# Patient Record
Sex: Male | Born: 1935 | Race: Black or African American | Hispanic: No | Smoking: Never smoker
Health system: Southern US, Community
[De-identification: ages and names within clinical notes are randomized; demographics above are authoritative.]

## PROBLEM LIST (undated history)

## (undated) DIAGNOSIS — I10 Essential (primary) hypertension: Secondary | ICD-10-CM

## (undated) DIAGNOSIS — Z9289 Personal history of other medical treatment: Secondary | ICD-10-CM

## (undated) DIAGNOSIS — N289 Disorder of kidney and ureter, unspecified: Secondary | ICD-10-CM

## (undated) DIAGNOSIS — K2210 Ulcer of esophagus without bleeding: Secondary | ICD-10-CM

## (undated) DIAGNOSIS — D696 Thrombocytopenia, unspecified: Secondary | ICD-10-CM

## (undated) DIAGNOSIS — K279 Peptic ulcer, site unspecified, unspecified as acute or chronic, without hemorrhage or perforation: Secondary | ICD-10-CM

## (undated) DIAGNOSIS — K922 Gastrointestinal hemorrhage, unspecified: Secondary | ICD-10-CM

## (undated) DIAGNOSIS — B1920 Unspecified viral hepatitis C without hepatic coma: Secondary | ICD-10-CM

## (undated) DIAGNOSIS — K754 Autoimmune hepatitis: Secondary | ICD-10-CM

## (undated) DIAGNOSIS — E785 Hyperlipidemia, unspecified: Secondary | ICD-10-CM

## (undated) DIAGNOSIS — N183 Chronic kidney disease, stage 3 (moderate): Secondary | ICD-10-CM

## (undated) HISTORY — DX: Essential (primary) hypertension: I10

## (undated) HISTORY — DX: Hyperlipidemia, unspecified: E78.5

---

## 2000-02-18 ENCOUNTER — Ambulatory Visit (HOSPITAL_COMMUNITY): Admission: RE | Admit: 2000-02-18 | Discharge: 2000-02-18 | Payer: Self-pay | Admitting: Nephrology

## 2000-02-21 ENCOUNTER — Ambulatory Visit (HOSPITAL_COMMUNITY): Admission: RE | Admit: 2000-02-21 | Discharge: 2000-02-22 | Payer: Self-pay | Admitting: Nephrology

## 2000-02-21 ENCOUNTER — Encounter: Payer: Self-pay | Admitting: Nephrology

## 2014-03-26 DIAGNOSIS — E785 Hyperlipidemia, unspecified: Secondary | ICD-10-CM | POA: Diagnosis not present

## 2014-03-26 DIAGNOSIS — I10 Essential (primary) hypertension: Secondary | ICD-10-CM | POA: Diagnosis not present

## 2014-06-25 DIAGNOSIS — I10 Essential (primary) hypertension: Secondary | ICD-10-CM | POA: Diagnosis not present

## 2014-06-25 DIAGNOSIS — E785 Hyperlipidemia, unspecified: Secondary | ICD-10-CM | POA: Diagnosis not present

## 2014-07-03 DIAGNOSIS — N189 Chronic kidney disease, unspecified: Secondary | ICD-10-CM | POA: Diagnosis not present

## 2014-07-03 DIAGNOSIS — R7989 Other specified abnormal findings of blood chemistry: Secondary | ICD-10-CM | POA: Diagnosis not present

## 2014-07-14 DIAGNOSIS — R740 Nonspecific elevation of levels of transaminase and lactic acid dehydrogenase [LDH]: Secondary | ICD-10-CM | POA: Diagnosis not present

## 2014-07-24 DIAGNOSIS — Z823 Family history of stroke: Secondary | ICD-10-CM | POA: Diagnosis not present

## 2014-07-24 DIAGNOSIS — N183 Chronic kidney disease, stage 3 (moderate): Secondary | ICD-10-CM | POA: Diagnosis not present

## 2014-07-24 DIAGNOSIS — I10 Essential (primary) hypertension: Secondary | ICD-10-CM | POA: Diagnosis not present

## 2014-07-24 DIAGNOSIS — Z8249 Family history of ischemic heart disease and other diseases of the circulatory system: Secondary | ICD-10-CM | POA: Diagnosis not present

## 2014-07-24 DIAGNOSIS — N051 Unspecified nephritic syndrome with focal and segmental glomerular lesions: Secondary | ICD-10-CM | POA: Diagnosis not present

## 2014-09-24 DIAGNOSIS — N183 Chronic kidney disease, stage 3 (moderate): Secondary | ICD-10-CM | POA: Diagnosis not present

## 2014-09-24 DIAGNOSIS — N281 Cyst of kidney, acquired: Secondary | ICD-10-CM | POA: Diagnosis not present

## 2014-12-30 DIAGNOSIS — N189 Chronic kidney disease, unspecified: Secondary | ICD-10-CM | POA: Diagnosis not present

## 2014-12-30 DIAGNOSIS — Z125 Encounter for screening for malignant neoplasm of prostate: Secondary | ICD-10-CM | POA: Diagnosis not present

## 2014-12-30 DIAGNOSIS — Z0000 Encounter for general adult medical examination without abnormal findings: Secondary | ICD-10-CM | POA: Diagnosis not present

## 2014-12-30 DIAGNOSIS — E785 Hyperlipidemia, unspecified: Secondary | ICD-10-CM | POA: Diagnosis not present

## 2014-12-30 DIAGNOSIS — Z23 Encounter for immunization: Secondary | ICD-10-CM | POA: Diagnosis not present

## 2014-12-30 DIAGNOSIS — I10 Essential (primary) hypertension: Secondary | ICD-10-CM | POA: Diagnosis not present

## 2015-01-05 DIAGNOSIS — N189 Chronic kidney disease, unspecified: Secondary | ICD-10-CM | POA: Diagnosis not present

## 2015-01-05 DIAGNOSIS — R7989 Other specified abnormal findings of blood chemistry: Secondary | ICD-10-CM | POA: Diagnosis not present

## 2015-01-05 DIAGNOSIS — R945 Abnormal results of liver function studies: Secondary | ICD-10-CM | POA: Diagnosis not present

## 2015-01-06 ENCOUNTER — Other Ambulatory Visit: Payer: Self-pay | Admitting: Family Medicine

## 2015-01-06 DIAGNOSIS — R7989 Other specified abnormal findings of blood chemistry: Secondary | ICD-10-CM

## 2015-01-06 DIAGNOSIS — R945 Abnormal results of liver function studies: Principal | ICD-10-CM

## 2015-01-15 ENCOUNTER — Ambulatory Visit
Admission: RE | Admit: 2015-01-15 | Discharge: 2015-01-15 | Disposition: A | Discharge status: Home / Self Care | Payer: Self-pay | Source: Ambulatory Visit | Attending: Family Medicine | Admitting: Family Medicine

## 2015-01-15 DIAGNOSIS — R748 Abnormal levels of other serum enzymes: Secondary | ICD-10-CM | POA: Diagnosis not present

## 2015-01-15 DIAGNOSIS — R945 Abnormal results of liver function studies: Principal | ICD-10-CM

## 2015-01-15 DIAGNOSIS — R7989 Other specified abnormal findings of blood chemistry: Secondary | ICD-10-CM

## 2015-01-22 ENCOUNTER — Other Ambulatory Visit: Payer: Self-pay | Admitting: Gastroenterology

## 2015-01-22 DIAGNOSIS — R945 Abnormal results of liver function studies: Secondary | ICD-10-CM

## 2015-01-22 DIAGNOSIS — R748 Abnormal levels of other serum enzymes: Secondary | ICD-10-CM | POA: Diagnosis not present

## 2015-01-22 DIAGNOSIS — R109 Unspecified abdominal pain: Secondary | ICD-10-CM

## 2015-01-22 DIAGNOSIS — R7989 Other specified abnormal findings of blood chemistry: Secondary | ICD-10-CM

## 2015-01-27 DIAGNOSIS — N183 Chronic kidney disease, stage 3 (moderate): Secondary | ICD-10-CM | POA: Diagnosis not present

## 2015-01-28 ENCOUNTER — Ambulatory Visit
Admission: RE | Admit: 2015-01-28 | Discharge: 2015-01-28 | Disposition: A | Discharge status: Home / Self Care | Payer: Commercial Managed Care - HMO | Source: Ambulatory Visit | Attending: Gastroenterology | Admitting: Gastroenterology

## 2015-01-28 DIAGNOSIS — R7989 Other specified abnormal findings of blood chemistry: Secondary | ICD-10-CM

## 2015-01-28 DIAGNOSIS — R109 Unspecified abdominal pain: Secondary | ICD-10-CM

## 2015-01-28 DIAGNOSIS — R1084 Generalized abdominal pain: Secondary | ICD-10-CM | POA: Diagnosis not present

## 2015-01-28 DIAGNOSIS — R945 Abnormal results of liver function studies: Secondary | ICD-10-CM

## 2015-02-02 ENCOUNTER — Other Ambulatory Visit (HOSPITAL_COMMUNITY): Payer: Self-pay | Admitting: Gastroenterology

## 2015-02-02 DIAGNOSIS — R748 Abnormal levels of other serum enzymes: Secondary | ICD-10-CM

## 2015-02-10 ENCOUNTER — Other Ambulatory Visit: Payer: Self-pay | Admitting: Radiology

## 2015-02-11 ENCOUNTER — Ambulatory Visit (HOSPITAL_COMMUNITY)
Admission: RE | Admit: 2015-02-11 | Discharge: 2015-02-11 | Disposition: A | Discharge status: Home / Self Care | Payer: Commercial Managed Care - HMO | Source: Ambulatory Visit | Attending: Gastroenterology | Admitting: Gastroenterology

## 2015-02-11 DIAGNOSIS — K739 Chronic hepatitis, unspecified: Secondary | ICD-10-CM | POA: Diagnosis not present

## 2015-02-11 DIAGNOSIS — K760 Fatty (change of) liver, not elsewhere classified: Secondary | ICD-10-CM | POA: Diagnosis not present

## 2015-02-11 DIAGNOSIS — R748 Abnormal levels of other serum enzymes: Secondary | ICD-10-CM

## 2015-02-11 DIAGNOSIS — R7989 Other specified abnormal findings of blood chemistry: Secondary | ICD-10-CM | POA: Diagnosis not present

## 2015-02-11 DIAGNOSIS — R945 Abnormal results of liver function studies: Secondary | ICD-10-CM | POA: Diagnosis not present

## 2015-02-11 LAB — CBC
HEMATOCRIT: 43 % (ref 39.0–52.0)
HEMOGLOBIN: 14.9 g/dL (ref 13.0–17.0)
MCH: 31.7 pg (ref 26.0–34.0)
MCHC: 34.7 g/dL (ref 30.0–36.0)
MCV: 91.5 fL (ref 78.0–100.0)
Platelets: 194 10*3/uL (ref 150–400)
RBC: 4.7 MIL/uL (ref 4.22–5.81)
RDW: 14.9 % (ref 11.5–15.5)
WBC: 6.9 10*3/uL (ref 4.0–10.5)

## 2015-02-11 LAB — APTT: aPTT: 38 seconds — ABNORMAL HIGH (ref 24–37)

## 2015-02-11 LAB — PROTIME-INR
INR: 1.18 (ref 0.00–1.49)
Prothrombin Time: 15.2 seconds (ref 11.6–15.2)

## 2015-02-11 MED ORDER — FENTANYL CITRATE (PF) 100 MCG/2ML IJ SOLN
INTRAMUSCULAR | Status: AC
  Filled 2015-02-11: qty 2

## 2015-02-11 MED ORDER — GELATIN ABSORBABLE 12-7 MM EX MISC
CUTANEOUS | Status: AC
  Filled 2015-02-11: qty 1

## 2015-02-11 MED ORDER — SODIUM CHLORIDE 0.9 % IV SOLN
Freq: Once | INTRAVENOUS | Status: AC
  Administered 2015-02-11: 12:00:00 via INTRAVENOUS

## 2015-02-11 MED ORDER — MIDAZOLAM HCL 2 MG/2ML IJ SOLN
INTRAMUSCULAR | Status: AC | PRN
  Administered 2015-02-11: 1 mg via INTRAVENOUS
  Administered 2015-02-11: 0.5 mg via INTRAVENOUS

## 2015-02-11 MED ORDER — MIDAZOLAM HCL 2 MG/2ML IJ SOLN
INTRAMUSCULAR | Status: AC
  Filled 2015-02-11: qty 2

## 2015-02-11 MED ORDER — FENTANYL CITRATE (PF) 100 MCG/2ML IJ SOLN
INTRAMUSCULAR | Status: AC | PRN
  Administered 2015-02-11: 25 ug via INTRAVENOUS
  Administered 2015-02-11: 50 ug via INTRAVENOUS

## 2015-02-11 MED ORDER — LIDOCAINE-EPINEPHRINE 1 %-1:100000 IJ SOLN
INTRAMUSCULAR | Status: AC
  Filled 2015-02-11: qty 1

## 2015-02-11 NOTE — Procedures (Signed)
Technically successful US guided biopsy of right lobe of the liver.   No immediate complications.   Jay Ereka Brau, MD Pager #: [phone]   

## 2015-02-11 NOTE — H&P (Signed)
Chief Complaint: Patient was seen in consultation today for US guided random liver biopsy  Referring Physician(s): Outlaw,William  History of Present Illness: James Hill is a 82 y.o. male with history of elevated LFT's and fatty liver by imaging who presents today for US guided random liver biopsy for further evaluation.   No past medical history on file.  No past surgical history on file.  Allergies: Review of patient's allergies indicates not on file.  Medications: Prior to Admission medications   Medication Sig Start Date End Date Taking? Authorizing Provider  amLODipine (NORVASC) 5 MG tablet Take 5 mg by mouth daily.   Yes Historical Provider, MD  aspirin EC 81 MG tablet Take 81 mg by mouth daily.   Yes Historical Provider, MD  losartan (COZAAR) 100 MG tablet Take 100 mg by mouth daily.   Yes Historical Provider, MD     No family history on file.  Social History   Social History  . Marital Status: Legally Separated    Spouse Name: N/A  . Number of Children: N/A  . Years of Education: N/A   Social History Main Topics  . Smoking status: Not on file  . Smokeless tobacco: Not on file  . Alcohol Use: Not on file  . Drug Use: Not on file  . Sexual Activity: Not on file   Other Topics Concern  . Not on file   Social History Narrative  . No narrative on file      Review of Systems  Constitutional: Negative for fever and chills.  Respiratory: Negative for cough and shortness of breath.   Cardiovascular: Negative for chest pain.  Gastrointestinal: Negative for abdominal pain.  Genitourinary: Negative for dysuria and hematuria.  Musculoskeletal: Negative for back pain.  Neurological: Negative for headaches.    Vital Signs: BP 159/75 mmHg  Pulse 103  Temp(Src) 98.4 F (36.9 C) (Oral)  Resp 18  Ht 5\' 3"  (1.6 m)  Wt 136 lb (61.689 kg)  BMI 24.10 kg/m2  SpO2 100%  Physical Exam  Constitutional: He is oriented to person, place, and time. He appears  well-developed and well-nourished.  Cardiovascular: Regular rhythm.   Sl tachy but regular  Pulmonary/Chest: Effort normal and breath sounds normal.  Abdominal: Soft. Bowel sounds are normal. There is no tenderness.  Musculoskeletal: Normal range of motion. He exhibits no edema.  Neurological: He is alert and oriented to person, place, and time.    Mallampati Score:     Imaging: Ct Abdomen Pelvis Wo Contrast  01/28/2015  CLINICAL DATA:  Elevated liver function tests, generalized abdominal pain. EXAM: CT ABDOMEN AND PELVIS WITHOUT CONTRAST TECHNIQUE: Multidetector CT imaging of the abdomen and pelvis was performed following the standard protocol without IV contrast. COMPARISON:  None. FINDINGS: Lower chest: Lung bases show no acute findings. Heart size normal. Coronary artery calcification. No pericardial or pleural effusion. Hepatobiliary: Liver is somewhat heterogeneous in attenuation. Low-attenuation lesions in the dome of the liver measure up to 4 mm, too small to characterize. Gallbladder is unremarkable. No biliary ductal dilatation. Pancreas: Negative. Spleen: Negative. Adrenals/Urinary Tract: Adrenal glands are unremarkable. 8 mm low-attenuation lesion in the interpolar right kidney is too small to characterize. No urinary stones. Ureters are decompressed. Bladder is grossly unremarkable. Stomach/Bowel: Stomach, small bowel, appendix and colon are unremarkable. Vascular/Lymphatic: Atherosclerotic calcification of the arterial vasculature without abdominal aortic aneurysm. No pathologically enlarged lymph nodes. Reproductive: Prostate is enlarged. Other: No free fluid. Periumbilical hernia contains fat. Mesenteries and peritoneum are  unremarkable. Musculoskeletal: No worrisome lytic or sclerotic lesions. Degenerative changes are seen in the spine. IMPRESSION: 1. Hepatic steatosis. 2. Coronary artery calcification. 3. Enlarged prostate. 4. Periumbilical hernia contains fat. Electronically Signed    By: Lorin Picket M.D.   On: 01/28/2015 16:08   US Abdomen Limited Ruq  01/15/2015  CLINICAL DATA:  Elevated serum transaminase levels. EXAM: US ABDOMEN LIMITED - RIGHT UPPER QUADRANT COMPARISON:  None. FINDINGS: Gallbladder: No shadowing gallstones or echogenic sludge. No gallbladder wall thickening or pericholecystic fluid. Negative sonographic Murphy sign according to the ultrasound technologist. Common bile duct: Diameter: Approximately 4 mm. Liver: Diffusely increased and coarsened echotexture without significant focal hepatic parenchymal abnormality. Scattered shadowing calcifications, the largest approximating 5 mm in the left lobe. Patent portal vein with hepatopetal flow. IMPRESSION: 1. Mild diffuse hepatic steatosis and/or hepatocellular disease with scattered calcified hepatic granulomata. No significant focal hepatic parenchymal abnormality. 2. Otherwise normal examination. Electronically Signed   By: Evangeline Dakin M.D.   On: 01/15/2015 09:08    Labs:  CBC:  Recent Labs  02/11/15 1215  WBC 6.9  HGB 14.9  HCT 43.0  PLT 194    COAGS:  Recent Labs  02/11/15 1215  INR 1.18  APTT 38*    BMP: No results for input(s): NA, K, CL, CO2, GLUCOSE, BUN, CALCIUM, CREATININE, GFRNONAA, GFRAA in the last 8760 hours.  Invalid input(s): CMP  LIVER FUNCTION TESTS: No results for input(s): BILITOT, AST, ALT, ALKPHOS, PROT, ALBUMIN in the last 8760 hours.  TUMOR MARKERS: No results for input(s): AFPTM, CEA, CA199, CHROMGRNA in the last 8760 hours.  Assessment and Plan: 82 y.o. male with history of elevated LFT's and fatty liver by imaging who presents today for US guided random liver biopsy for further evaluation. Risks and benefits discussed with the patient including, but not limited to bleeding, infection, damage to adjacent structures or low yield requiring additional tests.All of the patient's questions were answered, patient is agreeable to proceed.Consent signed and in  chart.      Thank you for this interesting consult.  I greatly enjoyed meeting James Hill and look forward to participating in their care.  A copy of this report was sent to the requesting provider on this date.  Electronically Signed: D. Rowe Robert 02/11/2015, 1:02 PM   I spent a total of 15 minutes  in face to face in clinical consultation, greater than 50% of which was counseling/coordinating care for US guided random liver biopsy

## 2015-02-11 NOTE — Discharge Instructions (Signed)

## 2015-03-02 DIAGNOSIS — R748 Abnormal levels of other serum enzymes: Secondary | ICD-10-CM | POA: Diagnosis not present

## 2015-03-04 DIAGNOSIS — K754 Autoimmune hepatitis: Secondary | ICD-10-CM | POA: Diagnosis not present

## 2015-03-20 DIAGNOSIS — K754 Autoimmune hepatitis: Secondary | ICD-10-CM | POA: Diagnosis not present

## 2015-03-20 DIAGNOSIS — R1310 Dysphagia, unspecified: Secondary | ICD-10-CM | POA: Diagnosis not present

## 2015-03-23 ENCOUNTER — Inpatient Hospital Stay (HOSPITAL_COMMUNITY)
Admission: EM | Admit: 2015-03-23 | Discharge: 2015-03-27 | DRG: 682 | Disposition: A | Discharge status: Home / Self Care | Payer: Commercial Managed Care - HMO | Attending: Internal Medicine | Admitting: Internal Medicine

## 2015-03-23 ENCOUNTER — Encounter (HOSPITAL_COMMUNITY): Payer: Self-pay | Admitting: Emergency Medicine

## 2015-03-23 ENCOUNTER — Emergency Department (HOSPITAL_COMMUNITY): Payer: Commercial Managed Care - HMO

## 2015-03-23 DIAGNOSIS — N183 Chronic kidney disease, stage 3 unspecified: Secondary | ICD-10-CM | POA: Diagnosis present

## 2015-03-23 DIAGNOSIS — K219 Gastro-esophageal reflux disease without esophagitis: Secondary | ICD-10-CM | POA: Diagnosis not present

## 2015-03-23 DIAGNOSIS — R740 Nonspecific elevation of levels of transaminase and lactic acid dehydrogenase [LDH]: Secondary | ICD-10-CM | POA: Diagnosis not present

## 2015-03-23 DIAGNOSIS — Z79899 Other long term (current) drug therapy: Secondary | ICD-10-CM | POA: Diagnosis not present

## 2015-03-23 DIAGNOSIS — K859 Acute pancreatitis without necrosis or infection, unspecified: Secondary | ICD-10-CM | POA: Diagnosis not present

## 2015-03-23 DIAGNOSIS — R7989 Other specified abnormal findings of blood chemistry: Secondary | ICD-10-CM

## 2015-03-23 DIAGNOSIS — B182 Chronic viral hepatitis C: Secondary | ICD-10-CM

## 2015-03-23 DIAGNOSIS — D649 Anemia, unspecified: Secondary | ICD-10-CM | POA: Diagnosis present

## 2015-03-23 DIAGNOSIS — D696 Thrombocytopenia, unspecified: Secondary | ICD-10-CM

## 2015-03-23 DIAGNOSIS — K7460 Unspecified cirrhosis of liver: Secondary | ICD-10-CM | POA: Diagnosis not present

## 2015-03-23 DIAGNOSIS — E43 Unspecified severe protein-calorie malnutrition: Secondary | ICD-10-CM | POA: Diagnosis present

## 2015-03-23 DIAGNOSIS — R633 Feeding difficulties: Secondary | ICD-10-CM | POA: Diagnosis not present

## 2015-03-23 DIAGNOSIS — K754 Autoimmune hepatitis: Secondary | ICD-10-CM | POA: Diagnosis not present

## 2015-03-23 DIAGNOSIS — R945 Abnormal results of liver function studies: Secondary | ICD-10-CM

## 2015-03-23 DIAGNOSIS — K210 Gastro-esophageal reflux disease with esophagitis: Secondary | ICD-10-CM | POA: Diagnosis present

## 2015-03-23 DIAGNOSIS — B1920 Unspecified viral hepatitis C without hepatic coma: Secondary | ICD-10-CM | POA: Diagnosis not present

## 2015-03-23 DIAGNOSIS — R634 Abnormal weight loss: Secondary | ICD-10-CM | POA: Diagnosis not present

## 2015-03-23 DIAGNOSIS — B3781 Candidal esophagitis: Secondary | ICD-10-CM | POA: Diagnosis present

## 2015-03-23 DIAGNOSIS — K209 Esophagitis, unspecified: Secondary | ICD-10-CM | POA: Diagnosis not present

## 2015-03-23 DIAGNOSIS — Z7982 Long term (current) use of aspirin: Secondary | ICD-10-CM | POA: Diagnosis not present

## 2015-03-23 DIAGNOSIS — R1310 Dysphagia, unspecified: Secondary | ICD-10-CM | POA: Diagnosis not present

## 2015-03-23 DIAGNOSIS — E860 Dehydration: Secondary | ICD-10-CM | POA: Diagnosis not present

## 2015-03-23 DIAGNOSIS — N179 Acute kidney failure, unspecified: Secondary | ICD-10-CM | POA: Diagnosis not present

## 2015-03-23 DIAGNOSIS — I129 Hypertensive chronic kidney disease with stage 1 through stage 4 chronic kidney disease, or unspecified chronic kidney disease: Secondary | ICD-10-CM | POA: Diagnosis present

## 2015-03-23 DIAGNOSIS — Z6821 Body mass index (BMI) 21.0-21.9, adult: Secondary | ICD-10-CM

## 2015-03-23 DIAGNOSIS — D6959 Other secondary thrombocytopenia: Secondary | ICD-10-CM | POA: Diagnosis not present

## 2015-03-23 DIAGNOSIS — K920 Hematemesis: Secondary | ICD-10-CM | POA: Diagnosis not present

## 2015-03-23 DIAGNOSIS — R531 Weakness: Secondary | ICD-10-CM | POA: Diagnosis present

## 2015-03-23 DIAGNOSIS — K208 Other esophagitis: Secondary | ICD-10-CM | POA: Diagnosis not present

## 2015-03-23 HISTORY — DX: Chronic kidney disease, stage 3 unspecified: N18.30

## 2015-03-23 HISTORY — DX: Thrombocytopenia, unspecified: D69.6

## 2015-03-23 HISTORY — DX: Unspecified viral hepatitis C without hepatic coma: B19.20

## 2015-03-23 LAB — COMPREHENSIVE METABOLIC PANEL
ALK PHOS: 67 U/L (ref 38–126)
ALT: 199 U/L — AB (ref 17–63)
AST: 126 U/L — AB (ref 15–41)
Albumin: 3.5 g/dL (ref 3.5–5.0)
Anion gap: 10 (ref 5–15)
BILIRUBIN TOTAL: 1.9 mg/dL — AB (ref 0.3–1.2)
BUN: 72 mg/dL — AB (ref 6–20)
CALCIUM: 9 mg/dL (ref 8.9–10.3)
CHLORIDE: 108 mmol/L (ref 101–111)
CO2: 22 mmol/L (ref 22–32)
CREATININE: 3.05 mg/dL — AB (ref 0.61–1.24)
GFR calc Af Amer: 21 mL/min — ABNORMAL LOW (ref >60)
GFR, EST NON AFRICAN AMERICAN: 18 mL/min — AB (ref >60)
Glucose, Bld: 113 mg/dL — ABNORMAL HIGH (ref 65–99)
Potassium: 5.1 mmol/L (ref 3.5–5.1)
Sodium: 140 mmol/L (ref 135–145)
Total Protein: 7.1 g/dL (ref 6.5–8.1)

## 2015-03-23 LAB — URINALYSIS, ROUTINE W REFLEX MICROSCOPIC
Bilirubin Urine: NEGATIVE
Glucose, UA: NEGATIVE mg/dL
Ketones, ur: NEGATIVE mg/dL
Nitrite: NEGATIVE
Protein, ur: 30 mg/dL — AB
Specific Gravity, Urine: 1.015 (ref 1.005–1.030)
pH: 5.5 (ref 5.0–8.0)

## 2015-03-23 LAB — CBC WITH DIFFERENTIAL/PLATELET
BASOS PCT: 0 %
Basophils Absolute: 0 10*3/uL (ref 0.0–0.1)
EOS ABS: 0 10*3/uL (ref 0.0–0.7)
Eosinophils Relative: 0 %
HEMATOCRIT: 41.8 % (ref 39.0–52.0)
Hemoglobin: 14.5 g/dL (ref 13.0–17.0)
LYMPHS PCT: 8 %
Lymphs Abs: 0.7 10*3/uL (ref 0.7–4.0)
MCH: 33 pg (ref 26.0–34.0)
MCHC: 34.7 g/dL (ref 30.0–36.0)
MCV: 95 fL (ref 78.0–100.0)
Monocytes Absolute: 0.6 10*3/uL (ref 0.1–1.0)
Monocytes Relative: 7 %
NEUTROS ABS: 7.2 10*3/uL (ref 1.7–7.7)
Neutrophils Relative %: 85 %
Platelets: 108 10*3/uL — ABNORMAL LOW (ref 150–400)
RBC: 4.4 MIL/uL (ref 4.22–5.81)
RDW: 15 % (ref 11.5–15.5)
WBC: 8.5 10*3/uL (ref 4.0–10.5)

## 2015-03-23 LAB — URINE MICROSCOPIC-ADD ON

## 2015-03-23 LAB — PROTIME-INR
INR: 1.21 (ref 0.00–1.49)
Prothrombin Time: 15.4 s — ABNORMAL HIGH (ref 11.6–15.2)

## 2015-03-23 LAB — RAPID STREP SCREEN (MED CTR MEBANE ONLY): STREPTOCOCCUS, GROUP A SCREEN (DIRECT): NEGATIVE

## 2015-03-23 LAB — LIPASE, BLOOD: Lipase: 60 U/L — ABNORMAL HIGH (ref 11–51)

## 2015-03-23 MED ORDER — ONDANSETRON HCL 4 MG/2ML IJ SOLN
4.0000 mg | Freq: Once | INTRAMUSCULAR | Status: AC
  Administered 2015-03-23: 4 mg via INTRAVENOUS
  Filled 2015-03-23: qty 2

## 2015-03-23 MED ORDER — SODIUM CHLORIDE 0.9 % IV BOLUS (SEPSIS)
500.0000 mL | Freq: Once | INTRAVENOUS | Status: AC
  Administered 2015-03-23: 500 mL via INTRAVENOUS

## 2015-03-23 MED ORDER — SUCRALFATE 1 GM/10ML PO SUSP
1.0000 g | Freq: Three times a day (TID) | ORAL | Status: DC
  Administered 2015-03-23 – 2015-03-27 (×13): 1 g via ORAL
  Filled 2015-03-23 (×16): qty 10

## 2015-03-23 MED ORDER — SODIUM CHLORIDE 0.9 % IV BOLUS (SEPSIS)
1000.0000 mL | Freq: Once | INTRAVENOUS | Status: AC
  Administered 2015-03-23: 1000 mL via INTRAVENOUS

## 2015-03-23 MED ORDER — FLUCONAZOLE IN SODIUM CHLORIDE 200-0.9 MG/100ML-% IV SOLN
200.0000 mg | INTRAVENOUS | Status: DC
  Administered 2015-03-23 – 2015-03-26 (×4): 200 mg via INTRAVENOUS
  Filled 2015-03-23 (×4): qty 100

## 2015-03-23 MED ORDER — DIPHENHYDRAMINE HCL 50 MG/ML IJ SOLN
25.0000 mg | Freq: Once | INTRAMUSCULAR | Status: AC
  Administered 2015-03-23: 25 mg via INTRAVENOUS
  Filled 2015-03-23: qty 1

## 2015-03-23 NOTE — ED Notes (Addendum)
Pt was diagnosed with Hep C 4 weeks ago and was started on prednisone. Dosage was increased about 2 weeks ago, granddaughter states since then pt has not able to hold anything down, worsening rash on arms and legs, c/o weakness. Pt started vomiting this morning. Also states he is down about 20 pounds since starting medication.

## 2015-03-23 NOTE — ED Provider Notes (Signed)
Complains of difficulty swallowing and unable to swallow solids for 2 weeks, symptoms accompanied by generalized weakness. Has foreign body sensation at throat. He is able to swallow liquids without difficulty on exam no distress handling secretions well  Voice is not hoarse.  Orlie Dakin, MD 03/23/15 2351

## 2015-03-23 NOTE — H&P (Signed)
[facility] Admission History and Physical       Fields James Hill DOB: 10/31/1933 DOA: 03/23/2015  Referring physician:  EDP PCP: James Nip, MD  Specialists:   Chief Complaint: Weakness  HPI: James Hill is a 82 y.o. male with a recent diagnosis of Hepatitis C being seen by GI and was placed on Prednisone Rx 1 month ago who presents to the ED with complaints of  odynophagia x 1 month.  He has had a weight loss of 20 pounds over the past month.   He reports pain with eating foods and drinking liquids.    He was prescribed lidocaine mouth wash a few days ago by his GI doctor.      Review of Systems:    Constitutional:  +Weight Loss, Night Sweats, Fevers, Chills, Dizziness, Light Headedness, Fatigue, +Generalized Weakness HEENT: No Headaches, +Difficulty Swallowing,Tooth/Dental Problems,Sore Throat,  No Sneezing, Rhinitis, Ear Ache, Nasal Congestion, or Post Nasal Drip,  Cardio-vascular:  No Chest pain, Orthopnea, PND, Edema in Lower Extremities, Anasarca, Dizziness, Palpitations  Resp: No Dyspnea, No DOE, No Productive Cough, No Non-Productive Cough, No Hemoptysis, No Wheezing.    GI: No Heartburn, Indigestion, Abdominal Pain, Nausea, Vomiting, Diarrhea, Constipation, Hematemesis, Hematochezia, Melena, Change in Bowel Habits,  Loss of Appetite  GU: No Dysuria, No Change in Color of Urine, No Urgency or Urinary Frequency, No Flank pain.  Musculoskeletal: No Joint Pain or Swelling, No Decreased Range of Motion, No Back Pain.  Neurologic: No Syncope, No Seizures, Muscle Weakness, Paresthesia, Vision Disturbance or Loss, No Diplopia, No Vertigo, No Difficulty Walking,  Skin: No Rash or Lesions. Psych: No Change in Mood or Affect, No Depression or Anxiety, No Memory loss, No Confusion, or Hallucinations   Past Medical History  Diagnosis Date  . Hepatitis C      History reviewed. No pertinent past surgical history.    Prior to Admission medications   Medication  Sig Start Date End Date Taking? Authorizing Provider  amLODipine (NORVASC) 5 MG tablet Take 5 mg by mouth daily.   Yes Historical Provider, MD  aspirin EC 81 MG tablet Take 81 mg by mouth daily.   Yes Historical Provider, MD  losartan (COZAAR) 100 MG tablet Take 100 mg by mouth daily.   Yes Historical Provider, MD  predniSONE (DELTASONE) 10 MG tablet Take 10 mg by mouth 4 (four) times daily.   Yes Historical Provider, MD     No Known Allergies  Social History:  reports that he has never smoked. He does not have any smokeless tobacco history on file. He reports that he drinks alcohol. His drug history is not on file.    No family history on file.     Physical Exam:  GEN:  Pleasant Thin Elderly  82 y.o. African American male examined and in no acute distress; cooperative with exam Filed Vitals:   03/23/15 1211 03/23/15 1519 03/23/15 1806 03/23/15 2012  BP: 102/74 149/77 119/82 129/75  Pulse: 118 100 106 92  Temp: 98.5 F (36.9 C)     TempSrc: Oral     Resp: 20 18 16 18   SpO2: 100% 100% 100% 99%   Blood pressure 129/75, pulse 92, temperature 98.5 F (36.9 C), temperature source Oral, resp. rate 18, SpO2 99 %. PSYCH: He is alert and oriented x4; does not appear anxious does not appear depressed; affect is normal HEENT: Normocephalic and Atraumatic, Mucous membranes pink; PERRLA; EOM intact; Fundi:  Benign;  No scleral icterus, Nares: Patent, Oropharynx: Clear,  Fair Dentition,    Neck:  FROM, No Cervical Lymphadenopathy nor Thyromegaly or Carotid Bruit; No JVD; Breasts:: Not examined CHEST WALL: No tenderness CHEST: Normal respiration, clear to auscultation bilaterally HEART: Regular rate and rhythm; no murmurs rubs or gallops BACK: No kyphosis or scoliosis; No CVA tenderness ABDOMEN: Positive Bowel Sounds, Scaphoid,Soft Non-Tender, No Rebound or Guarding; No Masses, No Organomegaly. Rectal Exam: Not done EXTREMITIES: No  Cyanosis, Clubbing, or Edema; No Ulcerations. Genitalia:  not examined PULSES: 2+ and symmetric SKIN: Normal hydration no rash or ulceration CNS:  Alert and Oriented x 4, No Focal Deficits Vascular: pulses palpable throughout    Labs on Admission:  Basic Metabolic Panel:  Recent Labs Lab 03/23/15 1421  NA 140  K 5.1  CL 108  CO2 22  GLUCOSE 113*  BUN 72*  CREATININE 3.05*  CALCIUM 9.0   Liver Function Tests:  Recent Labs Lab 03/23/15 1421  AST 126*  ALT 199*  ALKPHOS 67  BILITOT 1.9*  PROT 7.1  ALBUMIN 3.5    Recent Labs Lab 03/23/15 1421  LIPASE 60*   No results for input(s): AMMONIA in the last 168 hours. CBC:  Recent Labs Lab 03/23/15 1611  WBC 8.5  NEUTROABS 7.2  HGB 14.5  HCT 41.8  MCV 95.0  PLT 108*   Cardiac Enzymes: No results for input(s): CKTOTAL, CKMB, CKMBINDEX, TROPONINI in the last 168 hours.  BNP (last 3 results) No results for input(s): BNP in the last 8760 hours.  ProBNP (last 3 results) No results for input(s): PROBNP in the last 8760 hours.  CBG: No results for input(s): GLUCAP in the last 168 hours.  Radiological Exams on Admission: Ct Soft Tissue Neck Wo Contrast  03/23/2015  CLINICAL DATA:  82 year old male with dysphagia. Poor renal function. Initial encounter. EXAM: CT NECK WITHOUT CONTRAST TECHNIQUE: Multidetector CT imaging of the neck was performed following the standard protocol without intravenous contrast. COMPARISON:  None. FINDINGS: Pharynx and larynx: No mass or inflammation noted on this unenhanced exam. Coaptation membranes left fossa of Rosenmuller felt to be an incidental finding. Upper thoracic esophagus under distended and evaluation limited. Salivary glands: No primary worrisome mass or inflammation. Thyroid:  Motion degraded without obvious mass. Lymph nodes: No adenopathy. Vascular: Limited evaluation without contrast. Left carotid bifurcation calcifications noted. Limited intracranial: Limited imaging unremarkable. Visualized orbits: Limited imaging unremarkable.  Mastoids and visualized paranasal sinuses: Limited imaging unremarkable. Skeleton: Cervical spondylotic changes without bony destructive lesion. Upper chest: Negative. IMPRESSION: No mass or inflammation noted on this unenhanced exam. Electronically Signed   By: Genia Del M.D.   On: 03/23/2015 19:22       Assessment/Plan:   82 y.o. male with  Active Problems:    AKI (acute kidney injury) (HCC)/ Dehydration    IVFs    Monitor BUN/Cr    Hold Losartan Rx      Odynophagia    Empiric IV Diflucan for Possible Candidiasis    May Need GI evaluation for CMV or HSV Esophagitis      Weight loss- due to Odynophagia    Clear Liquids Diet    Nutrition consult       Hepatitis C    On Prednisone Rx    Monitor LFTs      CKD (chronic kidney disease), stage III- baseline Cr = 2.1    IVFs    Monitor BUN/CR      Thrombocytopenia ([place])- due to Cirrhosis    Monitor PLTs Trend  DVT Prophylaxis    SCDs          Code Status:     FULL CODE        Family Communication:   Daughter at Bedside  Disposition Plan:    Inpatient Status        Time spent:  [facility] Pager [phone]   If [place] Please Contact the Day Rounding Team MD for [facility]  If 7PM-7AM, Please Contact Night-Floor Coverage  [email or web address] Password [email or web address] 03/23/2015, 8:50 PM     ADDENDUM:   Patient was seen and examined on 03/23/2015

## 2015-03-23 NOTE — ED Provider Notes (Signed)
CSN: [id number]     Arrival date & time 03/23/15  1139 History   First MD Initiated Contact with Patient 03/23/15 1336     Chief Complaint  Patient presents with  . Rash  . Sore Throat  . Weakness  . Emesis   (Consider location/radiation/quality/duration/timing/severity/associated sxs/prior Treatment) Patient is a 82 y.o. male presenting with rash, pharyngitis, weakness, and vomiting. The history is provided by the patient and a relative. No language interpreter was used.  Rash Associated symptoms: nausea and vomiting   Associated symptoms: no fever   Sore Throat Associated symptoms include nausea, a rash, vomiting and weakness. Pertinent negatives include no chills, diaphoresis or fever.  Weakness Associated symptoms include nausea, a rash, vomiting and weakness. Pertinent negatives include no chills, diaphoresis or fever.  Emesis Associated symptoms: no chills    James Hill is a 81 y.o male with a history of CK D stage III, and hepatitis C who presents with weakness, nausea, vomiting x 1 this morning and multiple intermittent episodes during the past month, and rash on arms and legs. He states it started after his prednisone was increased from 30 mg a day to 40 mg/day 2 weeks ago. He was seen by his GI physician at [facility] 3 days ago for the same. He has since stopped taking the prednisone. He believes that this is causing his symptoms. He states he's lost 20 pounds since starting the medication one month ago.  He denies any fever, chills, night sweats, chest pain, shortness of breath, abdominal pain, diarrhea, constipation, or urinary symptoms. Denies being on dialysis. Last bowel movement was this morning. He denies any alcohol use.  Past Medical History  Diagnosis Date  . Hepatitis C    History reviewed. No pertinent past surgical history. No family history on file. Social History  Substance Use Topics  . Smoking status: Never Smoker   . Smokeless tobacco: None  . Alcohol Use:  Yes    Review of Systems  Constitutional: Negative for fever, chills and diaphoresis.  Gastrointestinal: Positive for nausea and vomiting.  Skin: Positive for rash.  Neurological: Positive for weakness.  All other systems reviewed and are negative.     Allergies  Review of patient's allergies indicates no known allergies.  Home Medications   Prior to Admission medications   Medication Sig Start Date End Date Taking? Authorizing Provider  amLODipine (NORVASC) 5 MG tablet Take 5 mg by mouth daily.   Yes Historical Provider, MD  aspirin EC 81 MG tablet Take 81 mg by mouth daily.   Yes Historical Provider, MD  losartan (COZAAR) 100 MG tablet Take 100 mg by mouth daily.   Yes Historical Provider, MD  predniSONE (DELTASONE) 10 MG tablet Take 10 mg by mouth 4 (four) times daily.   Yes Historical Provider, MD   BP 139/77 mmHg  Pulse 66  Temp(Src) 97.7 F (36.5 C) (Oral)  Resp 18  Ht 5\' 2"  (1.575 m)  Wt 56.7 kg  BMI 22.86 kg/m2  SpO2 97% Physical Exam  Constitutional: He is oriented to person, place, and time. He appears well-developed and well-nourished. No distress.  HENT:  Head: Normocephalic and atraumatic.  Eyes: Conjunctivae are normal.  Neck: Normal range of motion. Neck supple.  Cardiovascular: Normal rate, regular rhythm and normal heart sounds.   Regular rate and rhythm. No murmur. Lungs: Clear to auscultation bilaterally.  Pulmonary/Chest: Effort normal and breath sounds normal.  Abdominal: Soft. Normal appearance. He exhibits no distension. There is no hepatosplenomegaly. There  is no tenderness. There is no rebound and no guarding.  No abdominal tenderness to palpation. No guarding or rebound. No distention. Normal-appearing abdomen. No hepatosplenomegaly.  Musculoskeletal: Normal range of motion.  Neurological: He is alert and oriented to person, place, and time.  Skin: Skin is warm and dry.  No rash on exam.   Nursing note and vitals reviewed.   ED Course   Procedures (including critical care time) Labs Review Labs Reviewed  COMPREHENSIVE METABOLIC PANEL - Abnormal; Notable for the following:    Glucose, Bld 113 (*)    BUN 72 (*)    Creatinine, Ser 3.05 (*)    AST 126 (*)    ALT 199 (*)    Total Bilirubin 1.9 (*)    GFR calc non Af Amer 18 (*)    GFR calc Af Amer 21 (*)    All other components within normal limits  LIPASE, BLOOD - Abnormal; Notable for the following:    Lipase 60 (*)    All other components within normal limits  CBC WITH DIFFERENTIAL/PLATELET - Abnormal; Notable for the following:    Platelets 108 (*)    All other components within normal limits  URINALYSIS, ROUTINE W REFLEX MICROSCOPIC (NOT AT [facility]) - Abnormal; Notable for the following:    APPearance CLOUDY (*)    Hgb urine dipstick LARGE (*)    Protein, ur 30 (*)    Leukocytes, UA SMALL (*)    All other components within normal limits  PROTIME-INR - Abnormal; Notable for the following:    Prothrombin Time 15.4 (*)    All other components within normal limits  URINE MICROSCOPIC-ADD ON - Abnormal; Notable for the following:    Squamous Epithelial / LPF 0-5 (*)    Bacteria, UA FEW (*)    All other components within normal limits  CBC - Abnormal; Notable for the following:    RBC 4.18 (*)    Platelets 107 (*)    All other components within normal limits  BASIC METABOLIC PANEL - Abnormal; Notable for the following:    Chloride 116 (*)    CO2 20 (*)    Glucose, Bld 112 (*)    BUN 65 (*)    Creatinine, Ser 2.58 (*)    Calcium 8.4 (*)    GFR calc non Af Amer 22 (*)    GFR calc Af Amer 26 (*)    All other components within normal limits  HEPATIC FUNCTION PANEL - Abnormal; Notable for the following:    Total Protein 5.7 (*)    Albumin 2.7 (*)    AST 151 (*)    ALT 227 (*)    Total Bilirubin 2.0 (*)    Bilirubin, Direct 0.6 (*)    Indirect Bilirubin 1.4 (*)    All other components within normal limits  RAPID STREP SCREEN (NOT AT [facility])  CULTURE, GROUP A  STREP [facility])  CBC WITH DIFFERENTIAL/PLATELET    Imaging Review Ct Soft Tissue Neck Wo Contrast  03/23/2015  CLINICAL DATA:  82 year old male with dysphagia. Poor renal function. Initial encounter. EXAM: CT NECK WITHOUT CONTRAST TECHNIQUE: Multidetector CT imaging of the neck was performed following the standard protocol without intravenous contrast. COMPARISON:  None. FINDINGS: Pharynx and larynx: No mass or inflammation noted on this unenhanced exam. Coaptation membranes left fossa of Rosenmuller felt to be an incidental finding. Upper thoracic esophagus under distended and evaluation limited. Salivary glands: No primary worrisome mass or inflammation. Thyroid:  Motion degraded without obvious  mass. Lymph nodes: No adenopathy. Vascular: Limited evaluation without contrast. Left carotid bifurcation calcifications noted. Limited intracranial: Limited imaging unremarkable. Visualized orbits: Limited imaging unremarkable. Mastoids and visualized paranasal sinuses: Limited imaging unremarkable. Skeleton: Cervical spondylotic changes without bony destructive lesion. Upper chest: Negative. IMPRESSION: No mass or inflammation noted on this unenhanced exam. Electronically Signed   By: Genia Del M.D.   On: 03/23/2015 19:22   I have personally reviewed and evaluated these images and lab results as part of my medical decision-making.   EKG Interpretation None      MDM   Final diagnoses:  Acute kidney injury (HCC)  Elevated LFTs  Acute pancreatitis, unspecified pancreatitis type  Patient complains of abdominal pain, nausea, multiple episodes of vomiting, weakness, 20 pound weight loss within the last month, and rash. I could not appreciate a rash on exam. Patient has increased LFTs and acute kidney injury with pancreatitis. History of hepatitis C and recently diagnosed one month ago. Strep is negative. There is nothing to compare these labs with in our system. Patient is followed by [facility].  CBC is  still pending. I signed this patient out to [facility], PA-C regarding need for admission for AKI.      Ottie Glazier, PA-C 03/24/15 [place], MD 03/24/15 [phone]

## 2015-03-23 NOTE — ED Provider Notes (Signed)
Pt signed out to me at shift change. 82 year old male with history of CK D stage III, not on dialysis, recently diagnosed with hepatitis C 4 weeks ago by ultrasound-guided liver biopsy and started on prednisone. Over the last 4 weeks patient has had a 20 pound weight loss, worsening dysphagia, sore throat, nausea, vomiting and weakness. Patient was seen by his GI provider, Arta Silence, 3 days ago who decreased his prednisone dose to 30 mg 3 times a day however, patient decided to stop taking this medication as he feels like it is causing all of his new symptoms.  Creatinine today is 3.06. Upon review of patient's records this is elevated from his baseline which is normally 2.1. Patient has elevated LFTs, unknown what patient's baseline are. He received 1 L IV fluids, Benadryl with mottling of the symptoms. Will obtain CT soft tissue neck due to worsening sore throat and dysphagia to rule out a mass.   CT negative for acute abnormality. Dysphagia possibly due to candida, pt started on Diflucan. Will consult hospitalist to admit for AKI.   Patient was discussed with and seen by Dr. Winfred Leeds who agrees with the treatment plan.    Dondra Spry [place], PA-C 03/24/15 [id number]  Orlie Dakin, MD 03/24/15 [phone]

## 2015-03-24 LAB — HEPATIC FUNCTION PANEL
ALBUMIN: 2.7 g/dL — AB (ref 3.5–5.0)
ALT: 227 U/L — AB (ref 17–63)
AST: 151 U/L — AB (ref 15–41)
Alkaline Phosphatase: 50 U/L (ref 38–126)
BILIRUBIN DIRECT: 0.6 mg/dL — AB (ref 0.1–0.5)
Indirect Bilirubin: 1.4 mg/dL — ABNORMAL HIGH (ref 0.3–0.9)
Total Bilirubin: 2 mg/dL — ABNORMAL HIGH (ref 0.3–1.2)
Total Protein: 5.7 g/dL — ABNORMAL LOW (ref 6.5–8.1)

## 2015-03-24 LAB — BASIC METABOLIC PANEL
ANION GAP: 8 (ref 5–15)
BUN: 65 mg/dL — ABNORMAL HIGH (ref 6–20)
CALCIUM: 8.4 mg/dL — AB (ref 8.9–10.3)
CO2: 20 mmol/L — ABNORMAL LOW (ref 22–32)
Chloride: 116 mmol/L — ABNORMAL HIGH (ref 101–111)
Creatinine, Ser: 2.58 mg/dL — ABNORMAL HIGH (ref 0.61–1.24)
GFR, EST AFRICAN AMERICAN: 26 mL/min — AB (ref >60)
GFR, EST NON AFRICAN AMERICAN: 22 mL/min — AB (ref >60)
GLUCOSE: 112 mg/dL — AB (ref 65–99)
POTASSIUM: 4.7 mmol/L (ref 3.5–5.1)
Sodium: 144 mmol/L (ref 135–145)

## 2015-03-24 LAB — CBC
HEMATOCRIT: 40.5 % (ref 39.0–52.0)
Hemoglobin: 13.6 g/dL (ref 13.0–17.0)
MCH: 32.5 pg (ref 26.0–34.0)
MCHC: 33.6 g/dL (ref 30.0–36.0)
MCV: 96.9 fL (ref 78.0–100.0)
PLATELETS: 107 10*3/uL — AB (ref 150–400)
RBC: 4.18 MIL/uL — AB (ref 4.22–5.81)
RDW: 15.5 % (ref 11.5–15.5)
WBC: 6 10*3/uL (ref 4.0–10.5)

## 2015-03-24 MED ORDER — PREDNISONE 5 MG PO TABS
10.0000 mg | ORAL_TABLET | Freq: Four times a day (QID) | ORAL | Status: DC
  Filled 2015-03-24 (×5): qty 1

## 2015-03-24 MED ORDER — ACETAMINOPHEN 325 MG PO TABS: 650.0000 mg | ORAL_TABLET | Freq: Four times a day (QID) | ORAL | Status: DC | PRN

## 2015-03-24 MED ORDER — HYDROMORPHONE HCL 1 MG/ML IJ SOLN
0.5000 mg | INTRAMUSCULAR | Status: DC | PRN
  Administered 2015-03-25 – 2015-03-26 (×2): 1 mg via INTRAVENOUS
  Filled 2015-03-24 (×2): qty 1

## 2015-03-24 MED ORDER — ACETAMINOPHEN 650 MG RE SUPP: 650.0000 mg | Freq: Four times a day (QID) | RECTAL | Status: DC | PRN

## 2015-03-24 MED ORDER — DIPHENHYDRAMINE HCL 50 MG/ML IJ SOLN
25.0000 mg | Freq: Once | INTRAMUSCULAR | Status: DC
  Filled 2015-03-24: qty 1

## 2015-03-24 MED ORDER — ONDANSETRON HCL 4 MG/2ML IJ SOLN: 4.0000 mg | Freq: Four times a day (QID) | INTRAMUSCULAR | Status: DC | PRN

## 2015-03-24 MED ORDER — ALUM & MAG HYDROXIDE-SIMETH 200-200-20 MG/5ML PO SUSP: 30.0000 mL | Freq: Four times a day (QID) | ORAL | Status: DC | PRN

## 2015-03-24 MED ORDER — SODIUM CHLORIDE 0.9 % IV SOLN
INTRAVENOUS | Status: DC
  Administered 2015-03-24: 75 mL/h via INTRAVENOUS
  Administered 2015-03-24 – 2015-03-27 (×5): via INTRAVENOUS

## 2015-03-24 MED ORDER — ONDANSETRON HCL 4 MG PO TABS: 4.0000 mg | ORAL_TABLET | Freq: Four times a day (QID) | ORAL | Status: DC | PRN

## 2015-03-24 MED ORDER — OXYCODONE HCL 5 MG PO TABS
5.0000 mg | ORAL_TABLET | ORAL | Status: DC | PRN
  Administered 2015-03-26: 5 mg via ORAL
  Filled 2015-03-24: qty 1

## 2015-03-24 MED ORDER — ENOXAPARIN SODIUM 150 MG/ML ~~LOC~~ SOLN: 1.0000 mg/kg | Freq: Two times a day (BID) | SUBCUTANEOUS | Status: DC

## 2015-03-24 MED ORDER — AMLODIPINE BESYLATE 5 MG PO TABS
5.0000 mg | ORAL_TABLET | Freq: Every day | ORAL | Status: DC
  Administered 2015-03-24 – 2015-03-27 (×4): 5 mg via ORAL
  Filled 2015-03-24 (×4): qty 1

## 2015-03-24 NOTE — Progress Notes (Addendum)
[facility] PROGRESS NOTE   Ryoma Packett [id number] DOB: 05-Apr-1933 DOA: 03/23/2015 PCP: Aretta Nip, MD  HPI/Subjective: Seen with her granddaughter at bedside, denies any fever or chills. His swallowing is improved since yesterday.  Assessment/Plan: Active Problems:   AKI (acute kidney injury) ([place])   Odynophagia   Weight loss   Hepatitis C   Dehydration   CKD (chronic kidney disease), stage III   Thrombocytopenia (HCC)   Acute renal failure on CKD stage III His creatinine baseline is not in the records, granddaughter at bedside said it's around 2. Presented with creatinine of 3.0, started on IV fluids creatinine today is 2.5. Continue IV fluids check BMP in a.m.  Odynophagia Patient appears to have oral thrush causing odynophagia. He is on prednisone. Was not able to see anything, patient mentioned he is improved since yesterday. Started empirically on Diflucan, will continue.  Weight loss Patient mentioned he lost 20 pounds the last 3 weeks. According to the chart his weight was 136 on January 25, he is 125 pounds now, 11 pound weight loss. This is likely secondary to odynophagia and poor oral intake. Unlikely this to be secondary to adrenal insufficiency, he was on steroids for the past 3 weeks, no hypotension.  Hepatitis With recent liver biopsy showed chronic active hepatitis, started on steroids. Per family this is hepatitis C, will check hepatitis panel. LFTs elevated including AST, ALT and total bilirubin.  Thrombocytopenia This is likely secondary to liver disease, follow closely.  Elevated lipase Slightly elevated lipase at 60 likely to be secondary to elevated creatinine. Unlikely to be acute pancreatitis, denies any abdominal pain or nausea/vomiting.  Code Status: Full Code Family Communication: Plan discussed with the patient. Disposition Plan: Remains inpatient Diet: Diet Heart Room service appropriate?: Yes; Fluid consistency::  Thin  Consultants:  None  Procedures:  None  Antibiotics:  None   Objective: Filed Vitals:   03/24/15 0546 03/24/15 1000  BP: 139/77 122/68  Pulse: 66 70  Temp: 97.7 F (36.5 C) 98.7 F (37.1 C)  Resp: 18 18    Intake/Output Summary (Last 24 hours) at 03/24/15 1143 Last data filed at 03/24/15 1000  Gross per 24 hour  Intake  962.5 ml  Output      0 ml  Net  962.5 ml   Filed Weights   03/24/15 0547  Weight: 56.7 kg (125 lb)    Exam: General: Alert and awake, oriented x3, not in any acute distress. HEENT: anicteric sclera, pupils reactive to light and accommodation, EOMI CVS: S1-S2 clear, no murmur rubs or gallops Chest: clear to auscultation bilaterally, no wheezing, rales or rhonchi Abdomen: soft nontender, nondistended, normal bowel sounds, no organomegaly Extremities: no cyanosis, clubbing or edema noted bilaterally Neuro: Cranial nerves II-XII intact, no focal neurological deficits  Data Reviewed: Basic Metabolic Panel:  Recent Labs Lab 03/23/15 1421 03/24/15 0609  NA 140 144  K 5.1 4.7  CL 108 116*  CO2 22 20*  GLUCOSE 113* 112*  BUN 72* 65*  CREATININE 3.05* 2.58*  CALCIUM 9.0 8.4*   Liver Function Tests:  Recent Labs Lab 03/23/15 1421 03/24/15 0609  AST 126* 151*  ALT 199* 227*  ALKPHOS 67 50  BILITOT 1.9* 2.0*  PROT 7.1 5.7*  ALBUMIN 3.5 2.7*    Recent Labs Lab 03/23/15 1421  LIPASE 60*   No results for input(s): AMMONIA in the last 168 hours. CBC:  Recent Labs Lab 03/23/15 1611 03/24/15 0405  WBC 8.5 6.0  NEUTROABS 7.2  --  HGB 14.5 13.6  HCT 41.8 40.5  MCV 95.0 96.9  PLT 108* 107*   Cardiac Enzymes: No results for input(s): CKTOTAL, CKMB, CKMBINDEX, TROPONINI in the last 168 hours. BNP (last 3 results) No results for input(s): BNP in the last 8760 hours.  ProBNP (last 3 results) No results for input(s): PROBNP in the last 8760 hours.  CBG: No results for input(s): GLUCAP in the last 168  hours.  Micro Recent Results (from the past 240 hour(s))  Rapid strep screen     Status: None   Collection Time: 03/23/15  2:41 PM  Result Value Ref Range Status   Streptococcus, Group A Screen (Direct) NEGATIVE NEGATIVE Final    Comment: (NOTE) A Rapid Antigen test may result negative if the antigen level in the sample is below the detection level of this test. The FDA has not cleared this test as a stand-alone test therefore the rapid antigen negative result has reflexed to a Group A Strep culture.      Studies: Ct Soft Tissue Neck Wo Contrast  03/23/2015  CLINICAL DATA:  82 year old male with dysphagia. Poor renal function. Initial encounter. EXAM: CT NECK WITHOUT CONTRAST TECHNIQUE: Multidetector CT imaging of the neck was performed following the standard protocol without intravenous contrast. COMPARISON:  None. FINDINGS: Pharynx and larynx: No mass or inflammation noted on this unenhanced exam. Coaptation membranes left fossa of Rosenmuller felt to be an incidental finding. Upper thoracic esophagus under distended and evaluation limited. Salivary glands: No primary worrisome mass or inflammation. Thyroid:  Motion degraded without obvious mass. Lymph nodes: No adenopathy. Vascular: Limited evaluation without contrast. Left carotid bifurcation calcifications noted. Limited intracranial: Limited imaging unremarkable. Visualized orbits: Limited imaging unremarkable. Mastoids and visualized paranasal sinuses: Limited imaging unremarkable. Skeleton: Cervical spondylotic changes without bony destructive lesion. Upper chest: Negative. IMPRESSION: No mass or inflammation noted on this unenhanced exam. Electronically Signed   By: Genia Del M.D.   On: 03/23/2015 19:22    Scheduled Meds: . amLODipine  5 mg Oral Daily  . diphenhydrAMINE  25 mg Intravenous Once  . fluconazole (DIFLUCAN) IV  200 mg Intravenous Q24H  . predniSONE  10 mg Oral 4 times per day  . sucralfate  1 g Oral TID WC & HS    Continuous Infusions: . sodium chloride 75 mL/hr (03/24/15 0334)       Time spent: 35 minutes    [facility] A  [facility] Pager [phone] If 7PM-7AM, please contact night-coverage at [email or web address], password [facility] 03/24/2015, 11:43 AM  LOS: 1 day

## 2015-03-24 NOTE — Progress Notes (Signed)
Lovenox was not given before order was discontinued.  Please readdress.   Patients family also refused am prednisone because they said prednisone was the reason he is in the hospital.  Thanks.

## 2015-03-24 NOTE — Progress Notes (Signed)
Report called and given to receiving nurse [facility] N 12:08 PM  03-24-2015

## 2015-03-24 NOTE — Consult Note (Signed)
Referring Provider:  Dr. Verlee Monte   Primary Care Physician:  Aretta Nip, MD Primary Gastroenterologist:  Dr. Paulita Fujita  Reason for Consultation:  Dysphagia, odynophagia, weight loss  HPI: James Hill is a 82 y.o. male admitted through the ER yesterday b/o a roughly 6 wk h/o odynophagia and dysphagia (w/out frank obstructive sx) which began shortly after being started on prednisone for autoimmune hepatitis.  The pt was seen by Dr. Paulita Fujita, his primary GI, 4 days ago b/o these sx, and nystatin oral susp was started b/o thrush observed in the oral cavity.  Despite that treatment, the pt did not improve, and in view of the significant wt loss and even some difficulty getting liquids down, he presented to the ER where significant azotemia was noted.  He was admitted and has been started on Diflucan.  Pt repts anorexia, a 20-lb wt loss, no improvement in appetite on prednisone.   Past Medical History  Diagnosis Date  . Hepatitis C     History reviewed. No pertinent past surgical history.  Prior to Admission medications   Medication Sig Start Date End Date Taking? Authorizing Provider  amLODipine (NORVASC) 5 MG tablet Take 5 mg by mouth daily.   Yes Historical Provider, MD  aspirin EC 81 MG tablet Take 81 mg by mouth daily.   Yes Historical Provider, MD  losartan (COZAAR) 100 MG tablet Take 100 mg by mouth daily.   Yes Historical Provider, MD  predniSONE (DELTASONE) 10 MG tablet Take 10 mg by mouth 4 (four) times daily.   Yes Historical Provider, MD    Current Facility-Administered Medications  Medication Dose Route Frequency Provider Last Rate Last Dose  . 0.9 %  sodium chloride infusion   Intravenous Continuous Theressa Millard, MD 75 mL/hr at 03/24/15 1736    . acetaminophen (TYLENOL) tablet 650 mg  650 mg Oral Q6H PRN Theressa Millard, MD       Or  . acetaminophen (TYLENOL) suppository 650 mg  650 mg Rectal Q6H PRN Theressa Millard, MD      . alum & mag hydroxide-simeth  (MAALOX/MYLANTA) 200-200-20 MG/5ML suspension 30 mL  30 mL Oral Q6H PRN Theressa Millard, MD      . amLODipine (NORVASC) tablet 5 mg  5 mg Oral Daily Theressa Millard, MD   5 mg at 03/24/15 [id number]  . fluconazole (DIFLUCAN) IVPB 200 mg  200 mg Intravenous Q24H Theressa Millard, MD   200 mg at 03/23/15 2158  . HYDROmorphone (DILAUDID) injection 0.5-1 mg  0.5-1 mg Intravenous Q3H PRN Theressa Millard, MD      . ondansetron (ZOFRAN) tablet 4 mg  4 mg Oral Q6H PRN Theressa Millard, MD       Or  . ondansetron (ZOFRAN) injection 4 mg  4 mg Intravenous Q6H PRN Theressa Millard, MD      . oxyCODONE (Oxy IR/ROXICODONE) immediate release tablet 5 mg  5 mg Oral Q4H PRN Theressa Millard, MD      . predniSONE (DELTASONE) tablet 10 mg  10 mg Oral 4 times per day Theressa Millard, MD   10 mg at 03/24/15 0600  . sucralfate (CARAFATE) 1 GM/10ML suspension 1 g  1 g Oral TID WC & HS Theressa Millard, MD   1 g at 03/24/15 1736    Allergies as of 03/23/2015  . (No Known Allergies)    No family history on file.  Social History   Social History  .  Marital Status: Legally Separated    Spouse Name: N/A  . Number of Children: N/A  . Years of Education: N/A   Occupational History  . Not on file.   Social History Main Topics  . Smoking status: Never Smoker   . Smokeless tobacco: Not on file  . Alcohol Use: Yes  . Drug Use: Not on file  . Sexual Activity: Not on file   Other Topics Concern  . Not on file   Social History Narrative  . No narrative on file    Review of Systems:    No chest pain, no SOB  Physical Exam: Vital signs in last 24 hours: Temp:  [97.7 F (36.5 C)-98.9 F (37.2 C)] 98.9 F (37.2 C) (03/07 1224) Pulse Rate:  [66-105] 105 (03/07 1224) Resp:  [13-18] 16 (03/07 1224) BP: (122-152)/(68-85) 152/85 mmHg (03/07 1224) SpO2:  [97 %-100 %] 98 % (03/07 1224) Weight:  [56.7 kg (125 lb)] 56.7 kg (125 lb) (03/07 0547) Last BM Date: 03/23/15 General:   Alert,   Well-developed, well-nourished, pleasant and cooperative in NAD Head:  Normocephalic and atraumatic. Eyes:  Sclera clear, no icterus.   Conjunctiva pink. Mouth:   No ulcerations or lesions.  Oropharynx pink & moist. No thrush at present. Neck:   No masses or thyromegaly. Lungs:  Clear throughout to auscultation.   No wheezes, crackles, or rhonchi. No evident respiratory distress. Heart:   Regular rate and rhythm; no murmurs, clicks, rubs,  or gallops. Abdomen:  Soft, nontender, and nondistended. No masses, hepatosplenomegaly or ventral hernias noted.  Msk:   Symmetrical without gross deformities. Pulses:  Normal radial pulse is noted. Extremities:   Without clubbing, cyanosis, or edema. Neurologic:  Alert and coherent;  grossly normal neurologically. Skin:  Intact without significant lesions or rashes. Cervical Nodes:  No significant cervical adenopathy. Psych:   Alert and cooperative. Normal mood and affect.  Intake/Output from previous day: 03/06 0701 - 03/07 0700 In: 480 [P.O.:480] Out: -  Intake/Output this shift: Total I/O In: 482.5 [I.V.:482.5] Out: 0   Lab Results:  Recent Labs  03/23/15 1611 03/24/15 0405  WBC 8.5 6.0  HGB 14.5 13.6  HCT 41.8 40.5  PLT 108* 107*   BMET  Recent Labs  03/23/15 1421 03/24/15 0609  NA 140 144  K 5.1 4.7  CL 108 116*  CO2 22 20*  GLUCOSE 113* 112*  BUN 72* 65*  CREATININE 3.05* 2.58*  CALCIUM 9.0 8.4*   LFT  Recent Labs  03/24/15 0609  PROT 5.7*  ALBUMIN 2.7*  AST 151*  ALT 227*  ALKPHOS 50  BILITOT 2.0*  BILIDIR 0.6*  IBILI 1.4*   PT/INR  Recent Labs  03/23/15 1813  LABPROT 15.4*  INR 1.21    Studies/Results: Ct Soft Tissue Neck Wo Contrast  03/23/2015  CLINICAL DATA:  81 year old male with dysphagia. Poor renal function. Initial encounter. EXAM: CT NECK WITHOUT CONTRAST TECHNIQUE: Multidetector CT imaging of the neck was performed following the standard protocol without intravenous contrast. COMPARISON:   None. FINDINGS: Pharynx and larynx: No mass or inflammation noted on this unenhanced exam. Coaptation membranes left fossa of Rosenmuller felt to be an incidental finding. Upper thoracic esophagus under distended and evaluation limited. Salivary glands: No primary worrisome mass or inflammation. Thyroid:  Motion degraded without obvious mass. Lymph nodes: No adenopathy. Vascular: Limited evaluation without contrast. Left carotid bifurcation calcifications noted. Limited intracranial: Limited imaging unremarkable. Visualized orbits: Limited imaging unremarkable. Mastoids and visualized paranasal sinuses: Limited imaging unremarkable.  Skeleton: Cervical spondylotic changes without bony destructive lesion. Upper chest: Negative. IMPRESSION: No mass or inflammation noted on this unenhanced exam. Electronically Signed   By: Genia Del M.D.   On: 03/23/2015 19:22    Impression: 1. Odynophagia, presumably related to esoph candidiasis 2.  Anorexia, wt loss--not necessarily related to esophagitis 3.  Autoimmune hepatitis, with persistent elevation of transaminases despite several weeks of prednisone 4.  Chronic renal insufficiency w/ acute exacerbation ?due to dehydration (Creatinine at PCP was 2.1 in June 2016 and 2.4 in Dec. 2016)  Plan: Begin w/ egd tomorrow.  Nature, purpose, risks, alternatives, limitations d/w pt and dtr (Pam) at bedside.  Further eval for wt loss (?renal disease) may be needed, esp. If not much is seen on egd.     CT of abd 2 mos ago was neg for neoplasia.  Pt should have nephrology consultation if not already established w/ them.   LOS: 1 day   [place] V  03/24/2015, 6:56 PM   Pager [phone] If no answer or after 5 PM call [phone]

## 2015-03-25 ENCOUNTER — Encounter (HOSPITAL_COMMUNITY)
Admission: EM | Disposition: A | Discharge status: Home / Self Care | Payer: Self-pay | Source: Home / Self Care | Attending: Internal Medicine

## 2015-03-25 ENCOUNTER — Encounter (HOSPITAL_COMMUNITY): Payer: Self-pay

## 2015-03-25 ENCOUNTER — Inpatient Hospital Stay (HOSPITAL_COMMUNITY): Payer: Commercial Managed Care - HMO | Admitting: Anesthesiology

## 2015-03-25 DIAGNOSIS — K209 Esophagitis, unspecified: Secondary | ICD-10-CM

## 2015-03-25 HISTORY — PX: ESOPHAGOSCOPY: SHX5534

## 2015-03-25 LAB — HEPATIC FUNCTION PANEL
ALBUMIN: 2.4 g/dL — AB (ref 3.5–5.0)
ALT: 302 U/L — ABNORMAL HIGH (ref 17–63)
AST: 190 U/L — AB (ref 15–41)
Alkaline Phosphatase: 49 U/L (ref 38–126)
BILIRUBIN TOTAL: 1.5 mg/dL — AB (ref 0.3–1.2)
Bilirubin, Direct: 0.4 mg/dL (ref 0.1–0.5)
Indirect Bilirubin: 1.1 mg/dL — ABNORMAL HIGH (ref 0.3–0.9)
Total Protein: 5.4 g/dL — ABNORMAL LOW (ref 6.5–8.1)

## 2015-03-25 LAB — HEPATITIS PANEL, ACUTE
HCV Ab: <0.1 s/co ratio (ref 0.0–0.9)
HEP A IGM: NEGATIVE
Hep B C IgM: NEGATIVE
Hepatitis B Surface Ag: NEGATIVE

## 2015-03-25 SURGERY — ESOPHAGOSCOPY
Anesthesia: Monitor Anesthesia Care

## 2015-03-25 SURGERY — ESOPHAGOGASTRODUODENOSCOPY (EGD) WITH PROPOFOL
Anesthesia: Monitor Anesthesia Care

## 2015-03-25 MED ORDER — PROPOFOL 10 MG/ML IV BOLUS
INTRAVENOUS | Status: AC
  Filled 2015-03-25: qty 40

## 2015-03-25 MED ORDER — BUTAMBEN-TETRACAINE-BENZOCAINE 2-2-14 % EX AERO
INHALATION_SPRAY | CUTANEOUS | Status: DC | PRN
  Administered 2015-03-25: 2 via TOPICAL

## 2015-03-25 MED ORDER — PHENYLEPHRINE HCL 10 MG/ML IJ SOLN
INTRAMUSCULAR | Status: DC | PRN
  Administered 2015-03-25 (×2): 100 ug via INTRAVENOUS

## 2015-03-25 MED ORDER — PROPOFOL 500 MG/50ML IV EMUL
INTRAVENOUS | Status: DC | PRN
  Administered 2015-03-25: 30 mg via INTRAVENOUS

## 2015-03-25 MED ORDER — PROPOFOL 500 MG/50ML IV EMUL
INTRAVENOUS | Status: DC | PRN
  Administered 2015-03-25: 100 ug/kg/min via INTRAVENOUS

## 2015-03-25 MED ORDER — ENSURE ENLIVE PO LIQD
237.0000 mL | Freq: Three times a day (TID) | ORAL | Status: DC
  Administered 2015-03-25 – 2015-03-26 (×3): 237 mL via ORAL

## 2015-03-25 MED ORDER — PANTOPRAZOLE SODIUM 40 MG IV SOLR
40.0000 mg | Freq: Two times a day (BID) | INTRAVENOUS | Status: DC
  Administered 2015-03-25 – 2015-03-27 (×5): 40 mg via INTRAVENOUS
  Filled 2015-03-25 (×8): qty 40

## 2015-03-25 MED ORDER — LIDOCAINE HCL (CARDIAC) 20 MG/ML IV SOLN
INTRAVENOUS | Status: DC | PRN
  Administered 2015-03-25: 50 mg via INTRAVENOUS

## 2015-03-25 MED ORDER — PREDNISONE 5 MG PO TABS
10.0000 mg | ORAL_TABLET | Freq: Three times a day (TID) | ORAL | Status: DC
  Administered 2015-03-25 – 2015-03-27 (×5): 10 mg via ORAL
  Filled 2015-03-25 (×2): qty 2
  Filled 2015-03-25 (×2): qty 1
  Filled 2015-03-25 (×3): qty 2
  Filled 2015-03-25 (×3): qty 1

## 2015-03-25 SURGICAL SUPPLY — 15 items

## 2015-03-25 NOTE — Anesthesia Postprocedure Evaluation (Signed)
Anesthesia Post Note  Patient: James Hill  Procedure(s) Performed: Procedure(s): ESOPHAGOSCOPY  Patient location during evaluation: PACU Anesthesia Type: General Level of consciousness: awake and alert Pain management: pain level controlled Vital Signs Assessment: post-procedure vital signs reviewed and stable Respiratory status: spontaneous breathing, nonlabored ventilation, respiratory function stable and patient connected to nasal cannula oxygen Cardiovascular status: blood pressure returned to baseline and stable Postop Assessment: no signs of nausea or vomiting Anesthetic complications: no    Last Vitals:  Filed Vitals:   03/25/15 1100 03/25/15 1121  BP: 133/68 145/71  Pulse: 84 76  Temp:  36.6 C  Resp: 16 20    Last Pain:  Filed Vitals:   03/25/15 1122  PainSc: 0-No pain                 Yechiel Erny L

## 2015-03-25 NOTE — Op Note (Signed)
[facility] Patient Name: James Hill Procedure Date: 03/25/2015 MRN: [id number] Attending MD: Arta Silence , MD Date of Birth: Sep 04, 1933 CSN:  Age: 82 Admit Type: Inpatient Account #: [id number] Procedure:            Upper GI endoscopy Indications:          Odynophagia Providers:            Arta Silence, MD, Dustin Flock, RN, Alfonso Patten,                        Technician, Herbie Drape, CRNA Referring MD:          Medicines:            Propofol per Anesthesia Complications:        No immediate complications. Estimated Blood Loss: Estimated blood loss: none. Procedure:            Pre-Anesthesia Assessment:                       - Prior to the procedure, a History and Physical was                        performed, and patient medications and allergies were                        reviewed. The patient's tolerance of previous                        anesthesia was also reviewed. The risks and benefits of                        the procedure and the sedation options and risks were                        discussed with the patient. All questions were                        answered, and informed consent was obtained. Prior                        Anticoagulants: The patient has taken no previous                        anticoagulant or antiplatelet agents. ASA Grade                        Assessment: III - A patient with severe systemic                        disease. After reviewing the risks and benefits, the                        patient was deemed in satisfactory condition to undergo                        the procedure.                       After obtaining informed consent, the endoscope was  passed under direct vision. Throughout the procedure,                        the patient's blood pressure, pulse, and oxygen                        saturations were monitored continuously. The [id number]                        [id number]) scope was introduced  through the mouth, and                        advanced to the lower third of esophagus. The upper GI                        endoscopy was accomplished without difficulty. The                        patient tolerated the procedure well. Scope In: Scope Out: Findings:      LA Grade D (one or more mucosal breaks involving at least 75% of       esophageal circumference) esophagitis with bleeding was found. Deep       mucosal confluent ulceration noted in the proximal and mid esophagus.       The region was friable with mild touch with the scope. Ulcerations were       very deep and I felt biopsies were risk prohibitive, and I felt       continuing the procedure was risk-prohibitive. Impression:           - LA Grade D esophagitis. Appearance most consistent                        with severe erosive reflux esophagitis. Appearance not                        typical of monilial, herpetic or CMV esophagitis. Not                        improving with fluconazole.                       - No specimens collected. Moderate Sedation:      Per Anesthesia Care Recommendation:       - Return patient to hospital ward for ongoing care.                       - Full liquid diet until further notice.                       - Intravenous PPI.                       - On sucralfate presently; would be cautious with this,                        given his renal disease.                       - He is on too high of a prednisone dose (his problems  didn't start until his prednisone increased from 30 to                        40 mg per day); would decrease to 30 mg per day for now                        with down-titration to 20 mg per day in a few days and,                        ultimately, off. Benefits of treatment of his                        autoimmune hepatitis right now are far exceeded by the                        risks.                       - If doesn't make improvement soon,  would consider ID                        consultation for possible empiric treatment for                        herpetic and CMV esophagitis.                       - If doesn't make improvement soon, patient might need                        short-term TPN. He is not candidate for nasoenteric                        tube placement, given his severe esophagitis.                       [facility] will follow.                       - Post procedure medication orders were given. Procedure Code(s):    --- Professional ---                       [id number], Esophagoscopy, flexible, transoral; diagnostic,                        including collection of specimen(s) by brushing or                        washing, when performed (separate procedure) Diagnosis Code(s):    --- Professional ---                       K20.9, Esophagitis, unspecified                       R13.10, Dysphagia, unspecified CPT copyright 2016 [employer] Medical Association. All rights reserved. The codes documented in this report are preliminary and upon coder review may  be revised to meet current compliance requirements. Attending Participation: Arta Silence, MD Arta Silence, MD 03/25/2015 10:56:21 AM This report has been signed electronically. Number  of Addenda: 0

## 2015-03-25 NOTE — Progress Notes (Signed)
Initial Nutrition Assessment  DOCUMENTATION CODES:   Severe malnutrition in context of chronic illness  INTERVENTION:   - Provide Ensure Enlive po TID, each supplement provides 350 kcals and 20 grams of protein. - Recommend Magic Mouthwash if throat pain persists.   NUTRITION DIAGNOSIS:   Malnutrition related to chronic illness as evidenced by percent weight loss, energy intake < or equal to 75% for > or equal to 1 month, mild depletion of muscle mass, mild depletion of body fat.  GOAL:   Patient will meet greater than or equal to 90% of their needs  MONITOR:   PO intake, Supplement acceptance, Diet advancement, Weight trends, Labs  REASON FOR ASSESSMENT:   Malnutrition Screening Tool    ASSESSMENT:   82 y.o. male with a recent diagnosis of Hepatitis C being seen by GI and was placed on Prednisone Rx 1 month ago who presents to the ED with complaints of odynophagia x 1 month. He has had a weight loss of 20 pounds over the past month.  He reports pain with eating foods and drinking liquids.   Met with patient at beside, s/p esophagogastroduodenoscopy.  Findings include LA Grade D Esophagitis with bleeding and ulceration in the proximal and mid esophagus.    Patient reports a poor appetite and inability to eat due to difficulty of swallowing.  States that he has an easier time swallowing liquids than solid foods. Pt reports that this has been going on for about 6 weeks.  Patient states that he has not eaten anything since being in the hospital.    Nutrition Focused Physical Exam was completed.  Findings include mild fat depletion, mild muscle depletion, and no edema. Patient reports a 25 lb weight loss over the past 6 weeks.  Per chart review, patient had an 11 lb weight loss x 6 weeks, representing an 8% weight loss significant for time frame.  Weight loss is likely related to dysphagia and oral thrush causing odynophagia.   Medications reviewed.  Labs reviewed: elevated  BUN/creatinine.  Patient amenable to trying Ensure Enlive while in hospital.  Dietetic Intern to order.  Diet Order:  Diet full liquid Room service appropriate?: Yes; Fluid consistency:: Thin  Skin:  Reviewed, no issues  Last BM:  3/6  Height:   Ht Readings from Last 1 Encounters:  03/24/15 _0  (1.575 m)    Weight:   Wt Readings from Last 1 Encounters:  03/24/15 125 lb (56.7 kg)    Ideal Body Weight:  53.6 kg  BMI:  Body mass index is 22.86 kg/(m^2).  Estimated Nutritional Needs:   Kcal:  1600-1800  Protein:  65-75 grams  Fluid:  1.6 -1.8 L  EDUCATION NEEDS:   No education needs identified at this time  Veronda Prude, Dietetic Intern Pager: [phone]

## 2015-03-25 NOTE — Progress Notes (Signed)
PROGRESS NOTE    James Hill  [id number]  DOB: 22-Mar-1933  DOA: 03/23/2015 PCP: Aretta Nip, MD Outpatient Specialists:   Hospital course: 82 y.o. male with a recent diagnosis of Hepatitis C being seen by GI and was placed on Prednisone Rx 1 month ago who presents to the ED with complaints of odynophagia and dysphagia x 1 month. He has had a weight loss of 20 pounds over the past month. He reports pain with eating foods and drinking liquids.He was prescribed lidocaine mouth wash a few days ago by his GI doctor.    Assessment & Plan:   Acute renal failure on CKD stage III His creatinine baseline is not in the records, granddaughter at bedside said it's around 2. Presented with creatinine of 3.0, started on IV fluids creatinine improved to 2.5 Continue IV fluids check BMP in a.m.  Odynophagia secondary to candida esophagitis  GI was consulted and patient underwent EGD which confirmed esophagitis-report as below Follow GI recommendations.  Weight loss Patient mentioned he lost 20 pounds the last 3 weeks. According to the chart his weight was 136 on January 25, he is 125 pounds now, 11 pound weight loss. This is likely secondary to odynophagia and poor oral intake. Unlikely this to be secondary to adrenal insufficiency, he was on steroids for the past 3 weeks, no hypotension.  Autoimmune Hepatitis With recent liver biopsy showed chronic active hepatitis, started on steroids. Acute hepatitis panel negative. LFTs elevated including AST, ALT and total bilirubin. As recommended by GI, reduced dose of prednisone.  Thrombocytopenia This is likely secondary to liver disease, follow closely. Stable.  Elevated lipase Unclear significance but not pancreatitis.  DVT prophylaxis: SCDs Code Status: Full Code Family Communication: Discussed with patient's daughter at bedside Disposition Plan: Remains inpatient. DC home in 1-2 days.   Consultants:  [facility]  Procedures:  EGD 03/25/15: Impression: - LA Grade D esophagitis. Appearance most consistent   with severe erosive reflux esophagitis. Appearance not   typical of monilial, herpetic or CMV esophagitis. Not   improving with fluconazole.  - No specimens collected. Moderate Sedation:  Per Anesthesia Care Recommendation:   - Full liquid diet until further notice.  - Intravenous PPI.  - On sucralfate presently; would be cautious with this,   given his renal disease.  - He is on too high of a prednisone dose (his problems   didn't start until his prednisone increased from 30 to   40 mg per day); would decrease to 30 mg per day for now   with down-titration to 20 mg per day in a few days and,   ultimately, off. Benefits of treatment of his   autoimmune hepatitis right now are far exceeded by the   risks.  - If doesn't make improvement soon, would consider ID   consultation for possible empiric treatment for   herpetic and CMV esophagitis.  - If doesn't make improvement soon, patient might need   short-term TPN. He is not candidate for nasoenteric   tube placement, given his severe esophagitis.  [facility] will follow.  Antibiotics:  None   Subjective: Seen after EGD. Denied complaints. No pain on swallowing.  Objective: Filed Vitals:   03/25/15 1056 03/25/15 1100 03/25/15 1121 03/25/15 1314  BP:  133/68 145/71 118/67  Pulse: 92 84 76 107  Temp:   97.8 F (36.6 C) 98.1 F  (36.7 C)  TempSrc:   Oral Oral  Resp: 17 16 20  18  Height:      Weight:      SpO2: 100% 100% 99% 100%    Intake/Output Summary (Last 24 hours) at 03/25/15 1912 Last data filed at 03/25/15 1833  Gross per 24 hour  Intake   2472 ml  Output      0 ml  Net   2472 ml   Filed Weights   03/24/15 0547  Weight: 56.7 kg (125 lb)    Exam:  General exam: Pleasant elderly male lying comfortably supine in bed. Respiratory system: Clear. No increased work of breathing. Cardiovascular system: S1 & S2 heard, RRR. No JVD, murmurs, gallops, clicks or pedal edema. Gastrointestinal system: Abdomen is nondistended, soft and nontender. Normal bowel sounds heard. Central nervous system: Alert and oriented. No focal neurological deficits. Extremities: Symmetric 5 x 5 power.   Data Reviewed: Basic Metabolic Panel:  Recent Labs Lab 03/23/15 1421 03/24/15 0609  NA 140 144  K 5.1 4.7  CL 108 116*  CO2 22 20*  GLUCOSE 113* 112*  BUN 72* 65*  CREATININE 3.05* 2.58*  CALCIUM 9.0 8.4*   Liver Function Tests:  Recent Labs Lab 03/23/15 1421 03/24/15 0609 03/25/15 0334  AST 126* 151* 190*  ALT 199* 227* 302*  ALKPHOS 67 50 49  BILITOT 1.9* 2.0* 1.5*  PROT 7.1 5.7* 5.4*  ALBUMIN 3.5 2.7* 2.4*    Recent Labs Lab 03/23/15 1421  LIPASE 60*   No results for input(s): AMMONIA in the last 168 hours. CBC:  Recent Labs Lab 03/23/15 1611 03/24/15 0405  WBC 8.5 6.0  NEUTROABS 7.2  --   HGB 14.5 13.6  HCT 41.8 40.5  MCV 95.0 96.9  PLT 108* 107*   Cardiac Enzymes: No results for input(s): CKTOTAL, CKMB, CKMBINDEX, TROPONINI in the last 168 hours. BNP (last 3 results) No results for input(s): PROBNP in the last 8760 hours. CBG: No results for input(s): GLUCAP in the last 168 hours.  Recent Results (from the past 240 hour(s))  Rapid strep screen     Status: None   Collection Time: 03/23/15  2:41 PM  Result Value Ref Range Status   Streptococcus, Group A Screen (Direct)  NEGATIVE NEGATIVE Final    Comment: (NOTE) A Rapid Antigen test may result negative if the antigen level in the sample is below the detection level of this test. The FDA has not cleared this test as a stand-alone test therefore the rapid antigen negative result has reflexed to a Group A Strep culture.   Culture, group A strep     Status: None (Preliminary result)   Collection Time: 03/23/15  2:41 PM  Result Value Ref Range Status   Specimen Description THROAT  Final   Special Requests NONE Reflexed from [id number]  Final   Culture   Final    CULTURE REINCUBATED FOR BETTER GROWTH Performed at [facility]    Report Status PENDING  Incomplete         Studies: No results found.      Scheduled Meds: . amLODipine  5 mg Oral Daily  . feeding supplement (ENSURE ENLIVE)  237 mL Oral TID  . fluconazole (DIFLUCAN) IV  200 mg Intravenous Q24H  . pantoprazole (PROTONIX) IV  40 mg Intravenous Q12H  . predniSONE  10 mg Oral TID WC  . sucralfate  1 g Oral TID WC & HS   Continuous Infusions: . sodium chloride 75 mL/hr at 03/25/15 1819    Active Problems:   AKI (acute kidney injury) ([place])  Odynophagia   Weight loss   Hepatitis C   Dehydration   CKD (chronic kidney disease), stage III   Thrombocytopenia ([facility])    Time spent: 30 minutes.    Vernell Leep, MD, FACP, FHM. [facility] Pager [phone]  If 7PM-7AM, please contact night-coverage [email or web address] Password [email or web address] 03/25/2015, 7:12 PM    LOS: 2 days

## 2015-03-25 NOTE — Interval H&P Note (Signed)
History and Physical Interval Note:  03/25/2015 10:26 AM  James Hill  has presented today for surgery, with the diagnosis of dysphagia  The various methods of treatment have been discussed with the patient and family. After consideration of risks, benefits and other options for treatment, the patient has consented to  Procedure(s): ESOPHAGOGASTRODUODENOSCOPY (EGD) WITH PROPOFOL (N/A) as a surgical intervention .  The patient's history has been reviewed, patient examined, no change in status, stable for surgery.  I have reviewed the patient's chart and labs.  Questions were answered to the patient's satisfaction.     Youssef Footman M  Assessment:  1.  Odynophagia.  Plan:  1.  Endoscopy. 2.  Risks (bleeding, infection, bowel perforation that could require surgery, sedation-related changes in cardiopulmonary systems), benefits (identification and possible treatment of source of symptoms, exclusion of certain causes of symptoms), and alternatives (watchful waiting, radiographic imaging studies, empiric medical treatment) of upper endoscopy (EGD) were explained to patient/family in detail and patient wishes to proceed.

## 2015-03-25 NOTE — Transfer of Care (Signed)
Immediate Anesthesia Transfer of Care Note  Patient: James Hill  Procedure(s) Performed: Procedure(s): ESOPHAGOSCOPY  Patient Location: PACU  Anesthesia Type:MAC  Level of Consciousness: awake, alert  and oriented  Airway & Oxygen Therapy: Patient Spontanous Breathing and Patient connected to nasal cannula oxygen  Post-op Assessment: Report given to RN and Post -op Vital signs reviewed and stable  Post vital signs: Reviewed and stable  Last Vitals:  Filed Vitals:   03/25/15 0513 03/25/15 0958  BP: 120/62 149/83  Pulse: 95 86  Temp: 36.9 C 36.7 C  Resp: 16 20    Complications: No apparent anesthesia complications

## 2015-03-25 NOTE — H&P (View-Only) (Signed)
Referring Provider:  Dr. Verlee Monte   Primary Care Physician:  Aretta Nip, MD Primary Gastroenterologist:  Dr. Paulita Fujita  Reason for Consultation:  Dysphagia, odynophagia, weight loss  HPI: James Hill is a 82 y.o. male admitted through the ER yesterday b/o a roughly 6 wk h/o odynophagia and dysphagia (w/out frank obstructive sx) which began shortly after being started on prednisone for autoimmune hepatitis.  The pt was seen by Dr. Paulita Fujita, his primary GI, 4 days ago b/o these sx, and nystatin oral susp was started b/o thrush observed in the oral cavity.  Despite that treatment, the pt did not improve, and in view of the significant wt loss and even some difficulty getting liquids down, he presented to the ER where significant azotemia was noted.  He was admitted and has been started on Diflucan.  Pt repts anorexia, a 20-lb wt loss, no improvement in appetite on prednisone.   Past Medical History  Diagnosis Date  . Hepatitis C     History reviewed. No pertinent past surgical history.  Prior to Admission medications   Medication Sig Start Date End Date Taking? Authorizing Provider  amLODipine (NORVASC) 5 MG tablet Take 5 mg by mouth daily.   Yes Historical Provider, MD  aspirin EC 81 MG tablet Take 81 mg by mouth daily.   Yes Historical Provider, MD  losartan (COZAAR) 100 MG tablet Take 100 mg by mouth daily.   Yes Historical Provider, MD  predniSONE (DELTASONE) 10 MG tablet Take 10 mg by mouth 4 (four) times daily.   Yes Historical Provider, MD    Current Facility-Administered Medications  Medication Dose Route Frequency Provider Last Rate Last Dose  . 0.9 %  sodium chloride infusion   Intravenous Continuous Theressa Millard, MD 75 mL/hr at 03/24/15 1736    . acetaminophen (TYLENOL) tablet 650 mg  650 mg Oral Q6H PRN Theressa Millard, MD       Or  . acetaminophen (TYLENOL) suppository 650 mg  650 mg Rectal Q6H PRN Theressa Millard, MD      . alum & mag hydroxide-simeth  (MAALOX/MYLANTA) 200-200-20 MG/5ML suspension 30 mL  30 mL Oral Q6H PRN Theressa Millard, MD      . amLODipine (NORVASC) tablet 5 mg  5 mg Oral Daily Theressa Millard, MD   5 mg at 03/24/15 [id number]  . fluconazole (DIFLUCAN) IVPB 200 mg  200 mg Intravenous Q24H Theressa Millard, MD   200 mg at 03/23/15 2158  . HYDROmorphone (DILAUDID) injection 0.5-1 mg  0.5-1 mg Intravenous Q3H PRN Theressa Millard, MD      . ondansetron (ZOFRAN) tablet 4 mg  4 mg Oral Q6H PRN Theressa Millard, MD       Or  . ondansetron (ZOFRAN) injection 4 mg  4 mg Intravenous Q6H PRN Theressa Millard, MD      . oxyCODONE (Oxy IR/ROXICODONE) immediate release tablet 5 mg  5 mg Oral Q4H PRN Theressa Millard, MD      . predniSONE (DELTASONE) tablet 10 mg  10 mg Oral 4 times per day Theressa Millard, MD   10 mg at 03/24/15 0600  . sucralfate (CARAFATE) 1 GM/10ML suspension 1 g  1 g Oral TID WC & HS Theressa Millard, MD   1 g at 03/24/15 1736    Allergies as of 03/23/2015  . (No Known Allergies)    No family history on file.  Social History   Social History  .  Marital Status: Legally Separated    Spouse Name: N/A  . Number of Children: N/A  . Years of Education: N/A   Occupational History  . Not on file.   Social History Main Topics  . Smoking status: Never Smoker   . Smokeless tobacco: Not on file  . Alcohol Use: Yes  . Drug Use: Not on file  . Sexual Activity: Not on file   Other Topics Concern  . Not on file   Social History Narrative  . No narrative on file    Review of Systems:    No chest pain, no SOB  Physical Exam: Vital signs in last 24 hours: Temp:  [97.7 F (36.5 C)-98.9 F (37.2 C)] 98.9 F (37.2 C) (03/07 1224) Pulse Rate:  [66-105] 105 (03/07 1224) Resp:  [13-18] 16 (03/07 1224) BP: (122-152)/(68-85) 152/85 mmHg (03/07 1224) SpO2:  [97 %-100 %] 98 % (03/07 1224) Weight:  [56.7 kg (125 lb)] 56.7 kg (125 lb) (03/07 0547) Last BM Date: 03/23/15 General:   Alert,   Well-developed, well-nourished, pleasant and cooperative in NAD Head:  Normocephalic and atraumatic. Eyes:  Sclera clear, no icterus.   Conjunctiva pink. Mouth:   No ulcerations or lesions.  Oropharynx pink & moist. No thrush at present. Neck:   No masses or thyromegaly. Lungs:  Clear throughout to auscultation.   No wheezes, crackles, or rhonchi. No evident respiratory distress. Heart:   Regular rate and rhythm; no murmurs, clicks, rubs,  or gallops. Abdomen:  Soft, nontender, and nondistended. No masses, hepatosplenomegaly or ventral hernias noted.  Msk:   Symmetrical without gross deformities. Pulses:  Normal radial pulse is noted. Extremities:   Without clubbing, cyanosis, or edema. Neurologic:  Alert and coherent;  grossly normal neurologically. Skin:  Intact without significant lesions or rashes. Cervical Nodes:  No significant cervical adenopathy. Psych:   Alert and cooperative. Normal mood and affect.  Intake/Output from previous day: 03/06 0701 - 03/07 0700 In: 480 [P.O.:480] Out: -  Intake/Output this shift: Total I/O In: 482.5 [I.V.:482.5] Out: 0   Lab Results:  Recent Labs  03/23/15 1611 03/24/15 0405  WBC 8.5 6.0  HGB 14.5 13.6  HCT 41.8 40.5  PLT 108* 107*   BMET  Recent Labs  03/23/15 1421 03/24/15 0609  NA 140 144  K 5.1 4.7  CL 108 116*  CO2 22 20*  GLUCOSE 113* 112*  BUN 72* 65*  CREATININE 3.05* 2.58*  CALCIUM 9.0 8.4*   LFT  Recent Labs  03/24/15 0609  PROT 5.7*  ALBUMIN 2.7*  AST 151*  ALT 227*  ALKPHOS 50  BILITOT 2.0*  BILIDIR 0.6*  IBILI 1.4*   PT/INR  Recent Labs  03/23/15 1813  LABPROT 15.4*  INR 1.21    Studies/Results: Ct Soft Tissue Neck Wo Contrast  03/23/2015  CLINICAL DATA:  82 year old male with dysphagia. Poor renal function. Initial encounter. EXAM: CT NECK WITHOUT CONTRAST TECHNIQUE: Multidetector CT imaging of the neck was performed following the standard protocol without intravenous contrast. COMPARISON:   None. FINDINGS: Pharynx and larynx: No mass or inflammation noted on this unenhanced exam. Coaptation membranes left fossa of Rosenmuller felt to be an incidental finding. Upper thoracic esophagus under distended and evaluation limited. Salivary glands: No primary worrisome mass or inflammation. Thyroid:  Motion degraded without obvious mass. Lymph nodes: No adenopathy. Vascular: Limited evaluation without contrast. Left carotid bifurcation calcifications noted. Limited intracranial: Limited imaging unremarkable. Visualized orbits: Limited imaging unremarkable. Mastoids and visualized paranasal sinuses: Limited imaging unremarkable.  Skeleton: Cervical spondylotic changes without bony destructive lesion. Upper chest: Negative. IMPRESSION: No mass or inflammation noted on this unenhanced exam. Electronically Signed   By: Genia Del M.D.   On: 03/23/2015 19:22    Impression: 1. Odynophagia, presumably related to esoph candidiasis 2.  Anorexia, wt loss--not necessarily related to esophagitis 3.  Autoimmune hepatitis, with persistent elevation of transaminases despite several weeks of prednisone 4.  Chronic renal insufficiency w/ acute exacerbation ?due to dehydration (Creatinine at PCP was 2.1 in June 2016 and 2.4 in Dec. 2016)  Plan: Begin w/ egd tomorrow.  Nature, purpose, risks, alternatives, limitations d/w pt and dtr (Pam) at bedside.  Further eval for wt loss (?renal disease) may be needed, esp. If not much is seen on egd.     CT of abd 2 mos ago was neg for neoplasia.  Pt should have nephrology consultation if not already established w/ them.   LOS: 1 day   [place] V  03/24/2015, 6:56 PM   Pager [phone] If no answer or after 5 PM call [phone]

## 2015-03-25 NOTE — Anesthesia Preprocedure Evaluation (Addendum)
Anesthesia Evaluation  Patient identified by MRN, date of birth, ID band Patient awake    Reviewed: Allergy & Precautions, H&P , NPO status , Patient's Chart, lab work & pertinent test results  Airway Mallampati: II  TM Distance: >3 FB Neck ROM: full    Dental  (+) Dental Advisory Given, Missing Missing left upper front teeth:   Pulmonary neg pulmonary ROS,    Pulmonary exam normal breath sounds clear to auscultation       Cardiovascular Exercise Tolerance: Good negative cardio ROS Normal cardiovascular exam Rhythm:regular Rate:Normal     Neuro/Psych negative neurological ROS  negative psych ROS   GI/Hepatic negative GI ROS, (+) Hepatitis -, CElevated liver enzymes   Endo/Other  negative endocrine ROS  Renal/GU Renal InsufficiencyRenal diseaseStage 3 chronic kidney disease. CRT 2.58 BUN 61  negative genitourinary   Musculoskeletal   Abdominal   Peds  Hematology negative hematology ROS (+)   Anesthesia Other Findings   Reproductive/Obstetrics negative OB ROS                            Anesthesia Physical Anesthesia Plan  ASA: III  Anesthesia Plan: MAC   Post-op Pain Management:    Induction:   Airway Management Planned:   Additional Equipment:   Intra-op Plan:   Post-operative Plan:   Informed Consent: I have reviewed the patients History and Physical, chart, labs and discussed the procedure including the risks, benefits and alternatives for the proposed anesthesia with the patient or authorized representative who has indicated his/her understanding and acceptance.   Dental Advisory Given  Plan Discussed with: CRNA  Anesthesia Plan Comments:         Anesthesia Quick Evaluation

## 2015-03-26 LAB — CBC
HCT: 30.8 % — ABNORMAL LOW (ref 39.0–52.0)
HEMOGLOBIN: 10.7 g/dL — AB (ref 13.0–17.0)
MCH: 33.5 pg (ref 26.0–34.0)
MCHC: 34.7 g/dL (ref 30.0–36.0)
MCV: 96.6 fL (ref 78.0–100.0)
PLATELETS: 92 10*3/uL — AB (ref 150–400)
RBC: 3.19 MIL/uL — AB (ref 4.22–5.81)
RDW: 15.3 % (ref 11.5–15.5)
WBC: 6.5 10*3/uL (ref 4.0–10.5)

## 2015-03-26 LAB — BASIC METABOLIC PANEL
ANION GAP: 5 (ref 5–15)
BUN: 35 mg/dL — AB (ref 6–20)
CHLORIDE: 113 mmol/L — AB (ref 101–111)
CO2: 21 mmol/L — ABNORMAL LOW (ref 22–32)
Calcium: 7.9 mg/dL — ABNORMAL LOW (ref 8.9–10.3)
Creatinine, Ser: 2.01 mg/dL — ABNORMAL HIGH (ref 0.61–1.24)
GFR calc Af Amer: 35 mL/min — ABNORMAL LOW (ref >60)
GFR, EST NON AFRICAN AMERICAN: 30 mL/min — AB (ref >60)
Glucose, Bld: 125 mg/dL — ABNORMAL HIGH (ref 65–99)
POTASSIUM: 4.8 mmol/L (ref 3.5–5.1)
SODIUM: 139 mmol/L (ref 135–145)

## 2015-03-26 LAB — HEPATIC FUNCTION PANEL
ALBUMIN: 2.4 g/dL — AB (ref 3.5–5.0)
ALT: 254 U/L — ABNORMAL HIGH (ref 17–63)
AST: 122 U/L — AB (ref 15–41)
Alkaline Phosphatase: 50 U/L (ref 38–126)
BILIRUBIN DIRECT: 0.5 mg/dL (ref 0.1–0.5)
BILIRUBIN TOTAL: 1.2 mg/dL (ref 0.3–1.2)
Indirect Bilirubin: 0.7 mg/dL (ref 0.3–0.9)
Total Protein: 4.9 g/dL — ABNORMAL LOW (ref 6.5–8.1)

## 2015-03-26 LAB — CULTURE, GROUP A STREP (THRC)

## 2015-03-26 MED ORDER — BISACODYL 10 MG RE SUPP: 10.0000 mg | Freq: Once | RECTAL | Status: DC

## 2015-03-26 MED ORDER — SENNA 8.6 MG PO TABS
2.0000 | ORAL_TABLET | Freq: Every day | ORAL | Status: DC
  Administered 2015-03-26 – 2015-03-27 (×2): 17.2 mg via ORAL
  Filled 2015-03-26 (×2): qty 2

## 2015-03-26 NOTE — Progress Notes (Signed)
PROGRESS NOTE    James Hill  [id number]  DOB: Mar 14, 1933  DOA: 03/23/2015 PCP: Aretta Nip, MD Outpatient Specialists:   Hospital course: 82 y.o. male with a recent diagnosis of Hepatitis C being seen by GI and was placed on Prednisone Rx 1 month ago who presents to the ED with complaints of odynophagia and dysphagia x 1 month. He has had a weight loss of 20 pounds over the past month. He reports pain with eating foods and drinking liquids.He was prescribed lidocaine mouth wash a few days ago by his GI doctor.He was admitted for odynophagia. [facility] consulted. Underwent EGD 3/8 which confirmed esophagitis. Diet being advanced gradually. GI following and may clear for discharge home on 3/10.    Assessment & Plan:   Acute renal failure on CKD stage III His creatinine baseline is not in the records, granddaughter at bedside said it's around 2. Presented with creatinine of 3.0, started on IV fluids creatinine improved to 2.5 Continue IV fluids check BMP in a.m.  Odynophagia secondary to esophagitis  GI was consulted and patient underwent EGD which confirmed esophagitis-report as below Follow GI recommendations: Continue full liquid diet for additional day, prednisone reduced to 30 MG daily, remains on IV fluconazole/high-dose IV Protonix and sucralfate suspension. As per GI, continue current diet and medications for another day  Weight loss Patient mentioned he lost 20 pounds the last 3 weeks. According to the chart his weight was 136 on January 25, he is 125 pounds now, 11 pound weight loss. This is likely secondary to odynophagia and poor oral intake. Unlikely this to be secondary to adrenal insufficiency, he was on steroids for the past 3 weeks, no hypotension.  Autoimmune Hepatitis With recent liver biopsy showed chronic active hepatitis, started on steroids. Acute hepatitis panel negative. LFTs elevated including AST, ALT and total bilirubin. LFTs  improving. As recommended by GI, reduced dose of prednisone.  Thrombocytopenia This is likely secondary to liver disease, follow closely. Stable.  Elevated lipase Unclear significance but not pancreatitis.  Anemia - New.? Lab error. Follow CBC in a.m.   Essential hypertension - Continue amlodipine. Controlled.  DVT prophylaxis: SCDs Code Status: Full Code Family Communication: Discussed with patient's daughter at bedside Disposition Plan: Remains inpatient. DC home possibly 3/10 pending GI clearance.   Consultants:  [facility]  Procedures:  EGD 03/25/15: Impression: - LA Grade D esophagitis. Appearance most consistent   with severe erosive reflux esophagitis. Appearance not   typical of monilial, herpetic or CMV esophagitis. Not   improving with fluconazole.  - No specimens collected. Moderate Sedation:  Per Anesthesia Care Recommendation:   - Full liquid diet until further notice.  - Intravenous PPI.  - On sucralfate presently; would be cautious with this,   given his renal disease.  - He is on too high of a prednisone dose (his problems   didn't start until his prednisone increased from 30 to   40 mg per day); would decrease to 30 mg per day for now   with down-titration to 20 mg per day in a few days and,   ultimately, off. Benefits of treatment of his   autoimmune hepatitis right now are far exceeded by the   risks.  - If doesn't make improvement soon, would consider ID   consultation for possible empiric treatment for   herpetic and CMV  esophagitis.  - If doesn't make improvement soon, patient might need   short-term TPN. He is not candidate for nasoenteric  tube placement, given his severe esophagitis.  [facility] will follow.  Antibiotics:  None   Subjective: Mild throat pain on swallowing but improved compared to prior. Tolerating full liquids. Constipation-had small BM yesterday.   Objective: Filed Vitals:   03/26/15 0636 03/26/15 0941 03/26/15 1200 03/26/15 1301  BP: 105/55 123/65 122/69 122/69  Pulse: 84 100 91 91  Temp: 98 F (36.7 C) 97 F (36.1 C) 98.5 F (36.9 C) 98.5 F (36.9 C)  TempSrc: Oral Oral Oral Oral  Resp: 18 18 18 18   Height:      Weight:      SpO2: 100% 100% 100% 100%    Intake/Output Summary (Last 24 hours) at 03/26/15 1546 Last data filed at 03/26/15 1321  Gross per 24 hour  Intake   2814 ml  Output      0 ml  Net   2814 ml   Filed Weights   03/24/15 0547  Weight: 56.7 kg (125 lb)    Exam:  General exam: Pleasant elderly male lying comfortably supine in bed. Respiratory system: Clear. No increased work of breathing. Cardiovascular system: S1 & S2 heard, RRR. No JVD, murmurs, gallops, clicks or pedal edema. Gastrointestinal system: Abdomen is nondistended, soft and nontender. Normal bowel sounds heard. Central nervous system: Alert and oriented. No focal neurological deficits. Extremities: Symmetric 5 x 5 power.   Data Reviewed: Basic Metabolic Panel:  Recent Labs Lab 03/23/15 1421 03/24/15 0609 03/26/15 0321  NA 140 144 139  K 5.1 4.7 4.8  CL 108 116* 113*  CO2 22 20* 21*  GLUCOSE 113* 112* 125*  BUN 72* 65* 35*  CREATININE 3.05* 2.58* 2.01*  CALCIUM 9.0 8.4* 7.9*   Liver Function Tests:  Recent Labs Lab 03/23/15 1421 03/24/15 0609 03/25/15 0334 03/26/15 0321  AST 126* 151* 190* 122*  ALT 199* 227* 302* 254*  ALKPHOS 67 50 49 50  BILITOT 1.9*  2.0* 1.5* 1.2  PROT 7.1 5.7* 5.4* 4.9*  ALBUMIN 3.5 2.7* 2.4* 2.4*    Recent Labs Lab 03/23/15 1421  LIPASE 60*   No results for input(s): AMMONIA in the last 168 hours. CBC:  Recent Labs Lab 03/23/15 1611 03/24/15 0405 03/26/15 0321  WBC 8.5 6.0 6.5  NEUTROABS 7.2  --   --   HGB 14.5 13.6 10.7*  HCT 41.8 40.5 30.8*  MCV 95.0 96.9 96.6  PLT 108* 107* 92*   Cardiac Enzymes: No results for input(s): CKTOTAL, CKMB, CKMBINDEX, TROPONINI in the last 168 hours. BNP (last 3 results) No results for input(s): PROBNP in the last 8760 hours. CBG: No results for input(s): GLUCAP in the last 168 hours.  Recent Results (from the past 240 hour(s))  Rapid strep screen     Status: None   Collection Time: 03/23/15  2:41 PM  Result Value Ref Range Status   Streptococcus, Group A Screen (Direct) NEGATIVE NEGATIVE Final    Comment: (NOTE) A Rapid Antigen test may result negative if the antigen level in the sample is below the detection level of this test. The FDA has not cleared this test as a stand-alone test therefore the rapid antigen negative result has reflexed to a Group A Strep culture.   Culture, group A strep     Status: None   Collection Time: 03/23/15  2:41 PM  Result Value Ref Range Status   Specimen Description THROAT  Final   Special Requests NONE Reflexed from [id number]  Final   Culture   Final  NO GROUP A STREP (S.PYOGENES) ISOLATED Performed at [facility]    Report Status 03/26/2015 FINAL  Final         Studies: No results found.      Scheduled Meds: . amLODipine  5 mg Oral Daily  . bisacodyl  10 mg Rectal Once  . feeding supplement (ENSURE ENLIVE)  237 mL Oral TID  . fluconazole (DIFLUCAN) IV  200 mg Intravenous Q24H  . pantoprazole (PROTONIX) IV  40 mg Intravenous Q12H  . predniSONE  10 mg Oral TID WC  . senna  2 tablet Oral Daily  . sucralfate  1 g Oral TID WC & HS   Continuous Infusions: . sodium chloride 75 mL/hr at 03/26/15 1048     Active Problems:   AKI (acute kidney injury) ([place])   Odynophagia   Weight loss   Hepatitis C   Dehydration   CKD (chronic kidney disease), stage III   Thrombocytopenia ([facility])   Protein-calorie malnutrition, severe    Time spent: 30 minutes.    Vernell Leep, MD, FACP, FHM. [facility] Pager [phone]  If 7PM-7AM, please contact night-coverage [email or web address] Password [email or web address] 03/26/2015, 3:46 PM    LOS: 3 days

## 2015-03-26 NOTE — Care Management Important Message (Signed)
Important Message  Patient Details  Name: James Hill MRN: [id number] Date of Birth: 1933-05-25   Medicare Important Message Given:  Yes    Camillo Flaming 03/26/2015, 12:43 [place] Message  Patient Details  Name: James Hill MRN: [id number] Date of Birth: 1933-04-07   Medicare Important Message Given:  Yes    Camillo Flaming 03/26/2015, 12:42 PM

## 2015-03-26 NOTE — Progress Notes (Signed)
Swallowing somewhat more comfortable.  Tolerating full liquid diet with Ensure supplements.  Prednisone dose has been reduced to 30 mg daily.  Still on IV fluconazle.  Is on high-dose IV Protonix, plus sucralfate suspension b/o ?? Of severe GER as cause of esophagitis.  Would continue current diet and medication for another day; then may be able to further reduce prednisone to 20 mg per day as per Dr. Erlinda Hong recomm, and ??advance to soft diet if still doing well.  Discussed w/ pt and dtr.  Cleotis Nipper, M.D. Pager [phone] If no answer or after 5 PM call [phone]

## 2015-03-27 DIAGNOSIS — K754 Autoimmune hepatitis: Secondary | ICD-10-CM

## 2015-03-27 LAB — HEPATIC FUNCTION PANEL
ALT: 208 U/L — ABNORMAL HIGH (ref 17–63)
AST: 79 U/L — ABNORMAL HIGH (ref 15–41)
Albumin: 2.4 g/dL — ABNORMAL LOW (ref 3.5–5.0)
Alkaline Phosphatase: 58 U/L (ref 38–126)
BILIRUBIN DIRECT: 0.4 mg/dL (ref 0.1–0.5)
BILIRUBIN INDIRECT: 0.6 mg/dL (ref 0.3–0.9)
TOTAL PROTEIN: 5.2 g/dL — AB (ref 6.5–8.1)
Total Bilirubin: 1 mg/dL (ref 0.3–1.2)

## 2015-03-27 LAB — BASIC METABOLIC PANEL
ANION GAP: 4 — AB (ref 5–15)
BUN: 28 mg/dL — ABNORMAL HIGH (ref 6–20)
CALCIUM: 7.6 mg/dL — AB (ref 8.9–10.3)
CO2: 19 mmol/L — ABNORMAL LOW (ref 22–32)
Chloride: 110 mmol/L (ref 101–111)
Creatinine, Ser: 2.03 mg/dL — ABNORMAL HIGH (ref 0.61–1.24)
GFR, EST AFRICAN AMERICAN: 34 mL/min — AB (ref >60)
GFR, EST NON AFRICAN AMERICAN: 29 mL/min — AB (ref >60)
GLUCOSE: 136 mg/dL — AB (ref 65–99)
POTASSIUM: 4.8 mmol/L (ref 3.5–5.1)
SODIUM: 139 mmol/L (ref 135–145)

## 2015-03-27 LAB — CBC
HEMATOCRIT: 28.6 % — AB (ref 39.0–52.0)
HEMOGLOBIN: 10 g/dL — AB (ref 13.0–17.0)
MCH: 32.3 pg (ref 26.0–34.0)
MCHC: 35 g/dL (ref 30.0–36.0)
MCV: 92.3 fL (ref 78.0–100.0)
Platelets: 84 10*3/uL — ABNORMAL LOW (ref 150–400)
RBC: 3.1 MIL/uL — ABNORMAL LOW (ref 4.22–5.81)
RDW: 15.2 % (ref 11.5–15.5)
WBC: 8.6 10*3/uL (ref 4.0–10.5)

## 2015-03-27 MED ORDER — FLUCONAZOLE 200 MG PO TABS: 200.0000 mg | ORAL_TABLET | Freq: Every day | ORAL | Status: DC

## 2015-03-27 MED ORDER — PANTOPRAZOLE SODIUM 40 MG PO TBEC: 40.0000 mg | DELAYED_RELEASE_TABLET | Freq: Two times a day (BID) | ORAL | Status: DC

## 2015-03-27 MED ORDER — PREDNISONE 10 MG PO TABS
20.0000 mg | ORAL_TABLET | Freq: Every day | ORAL | Status: DC
Start: 2015-03-28 — End: 2016-02-19

## 2015-03-27 MED ORDER — ENSURE ENLIVE PO LIQD: 237.0000 mL | Freq: Three times a day (TID) | ORAL | Status: DC

## 2015-03-27 MED ORDER — PREDNISONE 5 MG PO TABS
10.0000 mg | ORAL_TABLET | Freq: Three times a day (TID) | ORAL | Status: DC
  Administered 2015-03-27: 10 mg via ORAL
  Filled 2015-03-27: qty 2

## 2015-03-27 NOTE — Progress Notes (Signed)
[facility] Progress Note  Subjective: Feels great, wants to go home  Objective: Vital signs in last 24 hours: Temp:  [97 F (36.1 C)-98.6 F (37 C)] 98 F (36.7 C) (03/10 0612) Pulse Rate:  [91-105] 105 (03/10 0612) Resp:  [18] 18 (03/10 0612) BP: (110-123)/(53-69) 110/55 mmHg (03/10 0612) SpO2:  [100 %] 100 % (03/10 0612) Weight change:    PE: Unchanged  Lab Results: Results for orders placed or performed during the hospital encounter of 03/23/15 (from the past 24 hour(s))  Hepatic function panel     Status: Abnormal   Collection Time: 03/27/15  3:19 AM  Result Value Ref Range   Total Protein 5.2 (L) 6.5 - 8.1 g/dL   Albumin 2.4 (L) 3.5 - 5.0 g/dL   AST 79 (H) 15 - 41 U/L   ALT 208 (H) 17 - 63 U/L   Alkaline Phosphatase 58 38 - 126 U/L   Total Bilirubin 1.0 0.3 - 1.2 mg/dL   Bilirubin, Direct 0.4 0.1 - 0.5 mg/dL   Indirect Bilirubin 0.6 0.3 - 0.9 mg/dL  Basic metabolic panel     Status: Abnormal   Collection Time: 03/27/15  3:19 AM  Result Value Ref Range   Sodium 133 (L) 135 - 145 mmol/L   Potassium 4.8 3.5 - 5.1 mmol/L   Chloride 110 101 - 111 mmol/L   CO2 19 (L) 22 - 32 mmol/L   Glucose, Bld 136 (H) 65 - 99 mg/dL   BUN 28 (H) 6 - 20 mg/dL   Creatinine, Ser 2.03 (H) 0.61 - 1.24 mg/dL   Calcium 7.6 (L) 8.9 - 10.3 mg/dL   GFR calc non Af Amer 29 (L) >60 mL/min   GFR calc Af Amer 34 (L) >60 mL/min   Anion gap 4 (L) 5 - 15  CBC     Status: Abnormal   Collection Time: 03/27/15  3:19 AM  Result Value Ref Range   WBC 8.6 4.0 - 10.5 K/uL   RBC 3.10 (L) 4.22 - 5.81 MIL/uL   Hemoglobin 10.0 (L) 13.0 - 17.0 g/dL   HCT 28.6 (L) 39.0 - 52.0 %   MCV 92.3 78.0 - 100.0 fL   MCH 32.3 26.0 - 34.0 pg   MCHC 35.0 30.0 - 36.0 g/dL   RDW 15.2 11.5 - 15.5 %   Platelets 84 (L) 150 - 400 K/uL    Studies/Results: No results found.    Assessment: 1. Moderately severe esophagitis, possible multifactorial etiology. Clinically doing well 2. Autoimmune  hepatitis  Plan: 1. Discharge on double dose oral PPI 2. Finish 7 days of Diflucan 3. Reduce prednisone to 20 mg a day 4. Discontinue Carafate 5. Follow-up with Dr. Paulita Fujita as outpatient 6. Will sign off.    Royal Beirne C 03/27/2015, 9:06 AM  Pager [phone] If no answer or after 5 PM call [phone]

## 2015-03-27 NOTE — Care Management Note (Signed)
Case Management Note  Patient Details  Name: James Hill MRN: [id number] Date of Birth: 1933-07-20  Subjective/Objective:     82 yo admitted with AKI               Action/Plan: From home alone. Independent with ADLs.  Expected Discharge Date:   (unknown)               Expected Discharge Plan:  Home/Self Care  In-House Referral:     Discharge planning Services  CM Consult  Post Acute Care Choice:    Choice offered to:     DME Arranged:    DME Agency:     HH Arranged:    HH Agency:     Status of Service:  In process, will continue to follow  Medicare Important Message Given:  Yes Date Medicare IM Given:    Medicare IM give by:    Date Additional Medicare IM Given:    Additional Medicare Important Message give by:     If discussed at [place] of Stay Meetings, dates discussed:    Additional Comments: Chart reviewed and no CM needs identified or communicated at this time. CM will continue to follow. [name] RN,BSN,NCM [id number] Lynnell Catalan, RN 03/27/2015, 12:22 PM

## 2015-03-27 NOTE — Discharge Summary (Signed)
Physician Discharge Summary  James Hill  [id number]  DOB: Feb 15, 1933  DOA: 03/23/2015  PCP: Aretta Nip, MD  Admit date: 03/23/2015 Discharge date: 03/27/2015  Time spent: Greater than 30 minutes  Recommendations for Outpatient Follow-up:  1. Dr. Milagros Evener, PCP in 1 week. To be seen with repeat labs (CBC & BMP). Patient is currently on prednisone 20 MG daily as per GI recommendations. He has been provided with a two-week supply. This has to be reviewed during the next visit, a week from hospital discharge to determine if dose has to be tapered down or continued until GI follow-up in 3 weeks. He will need new prescriptions for prednisone if decision to continue. 2. Dr. Arta Silence, [facility] in 3 weeks 3. Primary Nephrologist in [place], [place]. To be seen in one week.  Discharge Diagnoses:  Active Problems:   AKI (acute kidney injury) ([facility])   Odynophagia   Weight loss   Hepatitis C   Dehydration   CKD (chronic kidney disease), stage III   Thrombocytopenia (H[facility])   Protein-calorie malnutrition, severe   Discharge Condition: Improved & Stable  Diet recommendation: Soft diet.  Filed Weights   03/24/15 0547  Weight: 56.7 kg (125 lb)    History of present illness:  82 y.o. male with a recent diagnosis of Autoimmune hepatitis being seen by GI and was placed on Prednisone Rx 1 month ago who presented to the ED with complaints of odynophagia and dysphagia x 1 month. He has had a weight loss of 20 pounds over the past month. He reports pain with eating foods and drinking liquids.He was prescribed lidocaine mouth wash a few days ago by his GI doctor.He was admitted for odynophagia. [facility] consulted. Underwent EGD 3/8 which confirmed esophagitis. Diet being advanced gradually. GI has seen today and cleared for discharge home.  Hospital Course:   Acute renal failure on CKD stage III His creatinine baseline is not in the records, granddaughter at bedside said it's  around 2. Presented with creatinine of 3.0, started on IV fluids creatinine improved and plateaued in the low 2 range. Improved and stable. ARB currently held till outpatient follow-up with nephrology. Patient and family indicated that he follows up with a nephrologist in [place], [place] and cannot recollect the name.  Odynophagia secondary to esophagitis GI was consulted and patient underwent EGD which confirmed esophagitis-report as below GI has seen patient today. They indicate that he has moderately severe esophagitis, possibly multifactorial etiology. I discussed with Dr. Teena Irani. He recommends advancing to soft diet as tolerated, okay to discharge home on Protonix 40 mg twice a day, complete total 7 days of fluconazole, reduce prednisone to 20 mg daily, discontinue Carafate at discharge and outpatient follow-up with Dr. Paulita Fujita. - Improved.  Weight loss Patient mentioned he lost 20 pounds the last 3 weeks. According to the chart his weight was 136 on January 25, he is 125 pounds now, 11 pound weight loss. This is likely secondary to odynophagia and poor oral intake. Unlikely this to be secondary to adrenal insufficiency, he was on steroids for the past 3 weeks, no hypotension.  Autoimmune Hepatitis With recent liver biopsy showed chronic active hepatitis, started on steroids. Acute hepatitis panel negative. LFTs elevated including AST, ALT and total bilirubin. LFTs improving. As recommended by GI, reduced dose of prednisone. This is to be closely followed as an outpatient by his PCP/GI regarding duration of steroids, taper etc. He will need new prescriptions for prednisone.  Thrombocytopenia  This is likely secondary to liver disease, follow closely. Gradual drop over the last 4 days but no overt bleeding. Platelet counts at discharge: 84. Close outpatient follow-up with repeat CBCs and consider further evaluation as needed.  Elevated lipase Unclear significance but not  pancreatitis.  Anemia - New. Unclear etiology. No reported bleeding. Stable over the last 24 hours. Follow CBC as outpatient.  Essential hypertension - Continue amlodipine. Controlled. Held ARB for now secondary to recent acute kidney injury.    Consultants:  [facility]  Procedures:  EGD 03/25/15: Impression: - LA Grade D esophagitis. Appearance most consistent   with severe erosive reflux esophagitis. Appearance not   typical of monilial, herpetic or CMV esophagitis. Not   improving with fluconazole.  - No specimens collected. Moderate Sedation:  Per Anesthesia Care Recommendation:   - Full liquid diet until further notice.  - Intravenous PPI.  - On sucralfate presently; would be cautious with this,   given his renal disease.  - He is on too high of a prednisone dose (his problems   didn't start until his prednisone increased from 30 to   40 mg per day); would decrease to 30 mg per day for now   with down-titration to 20 mg per day in a few days and,   ultimately, off. Benefits of treatment of his   autoimmune hepatitis right now are far exceeded by the   risks.  - If doesn't make improvement soon, would consider ID   consultation for possible empiric treatment for   herpetic and CMV esophagitis.  - If doesn't make improvement soon, patient might need   short-term TPN. He is not candidate for nasoenteric   tube placement, given his severe esophagitis.  [facility] will  follow.    Discharge Exam:  Complaints: Anxious to go home. Denies pain or difficulty swallowing.  Filed Vitals:   03/26/15 1200 03/26/15 1301 03/26/15 2119 03/27/15 0612  BP: 122/69 122/69 111/53 110/55  Pulse: 91 91 95 105  Temp: 98.5 F (36.9 C) 98.5 F (36.9 C) 98.6 F (37 C) 98 F (36.7 C)  TempSrc: Oral Oral Oral Oral  Resp: 18 18  18   Height:      Weight:      SpO2: 100% 100% 100% 100%    General exam: Pleasant elderly male lying comfortably supine in bed. Respiratory system: Clear. No increased work of breathing. Cardiovascular system: S1 & S2 heard, RRR. No JVD, murmurs, gallops, clicks or pedal edema. Gastrointestinal system: Abdomen is nondistended, soft and nontender. Normal bowel sounds heard. Central nervous system: Alert and oriented. No focal neurological deficits. Extremities: Symmetric 5 x 5 power.  Discharge Instructions      Discharge Instructions    Call MD for:  difficulty breathing, headache or visual disturbances    Complete by:  As directed      Call MD for:  extreme fatigue    Complete by:  As directed      Call MD for:  persistant dizziness or light-headedness    Complete by:  As directed      Call MD for:  persistant nausea and vomiting    Complete by:  As directed      Call MD for:  severe uncontrolled pain    Complete by:  As directed      Call MD for:  temperature >100.4    Complete by:  As directed      Discharge instructions    Complete by:  As directed   DIET: soft diet until follow up with your GI MD.     Increase activity slowly    Complete by:  As directed             Medication List    STOP taking these medications        losartan 100 MG tablet  Commonly known as:  COZAAR      TAKE these medications        amLODipine 5 MG tablet  Commonly known as:  NORVASC  Take 5 mg by mouth daily.     aspirin EC 81 MG tablet  Take 81 mg by mouth daily.     feeding supplement (ENSURE ENLIVE) Liqd  Take  237 mLs by mouth 3 (three) times daily.     fluconazole 200 MG tablet  Commonly known as:  DIFLUCAN  Take 1 tablet (200 mg total) by mouth daily.     pantoprazole 40 MG tablet  Commonly known as:  PROTONIX  Take 1 tablet (40 mg total) by mouth 2 (two) times daily before a meal.     predniSONE 10 MG tablet  Commonly known as:  DELTASONE  Take 2 tablets (20 mg total) by mouth daily with breakfast.  Start taking on:  03/28/2015       Follow-up Information    Follow up with Landry Dyke, MD. Schedule an appointment as soon as possible for a visit in 3 weeks.   Specialty:  Gastroenterology   Contact information:   [id number] [place] [address] [phone]       Follow up with Aretta Nip, MD. Schedule an appointment as soon as possible for a visit in 1 week.   Specialty:  Family Medicine   Why:  To be seen with repeat labs (CBC & BMP)   Contact information:   [place] [address] [phone]       Follow up with Primary Nephrologist/kidney physician in [facility]. Schedule an appointment as soon as possible for a visit in 1 week.      Get Medicines reviewed and adjusted: Please take all your medications with you for your next visit with your Primary MD  Please request your Primary MD to go over all hospital tests and procedure/radiological results at the follow up. Please ask your Primary MD to get all Hospital records sent to his/her office.  If you experience worsening of your admission symptoms, develop shortness of breath, life threatening emergency, suicidal or homicidal thoughts you must seek medical attention immediately by calling 911 or calling your MD immediately if symptoms less severe.  You must read complete instructions/literature along with all the possible adverse reactions/side effects for all the Medicines you take and that have been prescribed to you. Take any new Medicines after you have completely  understood and accept all the possible adverse reactions/side effects.   Do not drive when taking pain medications.   Do not take more than prescribed Pain, Sleep and Anxiety Medications  Special Instructions: If you have smoked or chewed Tobacco in the last 2 yrs please stop smoking, stop any regular Alcohol and or any Recreational drug use.  Wear Seat belts while driving.  Please note  You were cared for by a hospitalist during your hospital stay. Once you are discharged, your primary care physician will handle any further medical issues. Please note that NO REFILLS for any discharge medications will be authorized once you are  discharged, as it is imperative that you return to your primary care physician (or establish a relationship with a primary care physician if you do not have one) for your aftercare needs so that they can reassess your need for medications and monitor your lab values.    The results of significant diagnostics from this hospitalization (including imaging, microbiology, ancillary and laboratory) are listed below for reference.    Significant Diagnostic Studies: Ct Soft Tissue Neck Wo Contrast  03/23/2015  CLINICAL DATA:  82 year old male with dysphagia. Poor renal function. Initial encounter. EXAM: CT NECK WITHOUT CONTRAST TECHNIQUE: Multidetector CT imaging of the neck was performed following the standard protocol without intravenous contrast. COMPARISON:  None. FINDINGS: Pharynx and larynx: No mass or inflammation noted on this unenhanced exam. Coaptation membranes left fossa of Rosenmuller felt to be an incidental finding. Upper thoracic esophagus under distended and evaluation limited. Salivary glands: No primary worrisome mass or inflammation. Thyroid:  Motion degraded without obvious mass. Lymph nodes: No adenopathy. Vascular: Limited evaluation without contrast. Left carotid bifurcation calcifications noted. Limited intracranial: Limited imaging unremarkable.  Visualized orbits: Limited imaging unremarkable. Mastoids and visualized paranasal sinuses: Limited imaging unremarkable. Skeleton: Cervical spondylotic changes without bony destructive lesion. Upper chest: Negative. IMPRESSION: No mass or inflammation noted on this unenhanced exam. Electronically Signed   By: Genia Del M.D.   On: 03/23/2015 19:22    Microbiology: Recent Results (from the past 240 hour(s))  Rapid strep screen     Status: None   Collection Time: 03/23/15  2:41 PM  Result Value Ref Range Status   Streptococcus, Group A Screen (Direct) NEGATIVE NEGATIVE Final    Comment: (NOTE) A Rapid Antigen test may result negative if the antigen level in the sample is below the detection level of this test. The FDA has not cleared this test as a stand-alone test therefore the rapid antigen negative result has reflexed to a Group A Strep culture.   Culture, group A strep     Status: None   Collection Time: 03/23/15  2:41 PM  Result Value Ref Range Status   Specimen Description THROAT  Final   Special Requests NONE Reflexed from [id number]  Final   Culture   Final    NO GROUP A STREP (S.PYOGENES) ISOLATED Performed at [facility]    Report Status 03/26/2015 FINAL  Final     Labs: Basic Metabolic Panel:  Recent Labs Lab 03/23/15 1421 03/24/15 0609 03/26/15 0321 03/27/15 0319  NA 140 144 139 133*  K 5.1 4.7 4.8 4.8  CL 108 116* 113* 110  CO2 22 20* 21* 19*  GLUCOSE 113* 112* 125* 136*  BUN 72* 65* 35* 28*  CREATININE 3.05* 2.58* 2.01* 2.03*  CALCIUM 9.0 8.4* 7.9* 7.6*   Liver Function Tests:  Recent Labs Lab 03/23/15 1421 03/24/15 0609 03/25/15 0334 03/26/15 0321 03/27/15 0319  AST 126* 151* 190* 122* 79*  ALT 199* 227* 302* 254* 208*  ALKPHOS 67 50 49 50 58  BILITOT 1.9* 2.0* 1.5* 1.2 1.0  PROT 7.1 5.7* 5.4* 4.9* 5.2*  ALBUMIN 3.5 2.7* 2.4* 2.4* 2.4*    Recent Labs Lab 03/23/15 1421  LIPASE 60*   No results for input(s): AMMONIA in the last 168  hours. CBC:  Recent Labs Lab 03/23/15 1611 03/24/15 0405 03/26/15 0321 03/27/15 0319  WBC 8.5 6.0 6.5 8.6  NEUTROABS 7.2  --   --   --   HGB 14.5 13.6 10.7* 10.0*  HCT 41.8 40.5 30.8* 28.6*  MCV 95.0 96.9 96.6 92.3  PLT 108* 107* 92* 84*   Cardiac Enzymes: No results for input(s): CKTOTAL, CKMB, CKMBINDEX, TROPONINI in the last 168 hours. BNP: BNP (last 3 results) No results for input(s): BNP in the last 8760 hours.  ProBNP (last 3 results) No results for input(s): PROBNP in the last 8760 hours.  CBG: No results for input(s): GLUCAP in the last 168 hours.     Signed:  Vernell Leep, MD, FACP, FHM. [facility] Pager [phone]  If 7PM-7AM, please contact night-coverage [email or web address] Password [facility] 03/27/2015, 12:25 PM

## 2015-04-02 ENCOUNTER — Inpatient Hospital Stay (HOSPITAL_COMMUNITY): Payer: Commercial Managed Care - HMO | Admitting: Anesthesiology

## 2015-04-02 ENCOUNTER — Emergency Department (HOSPITAL_COMMUNITY): Payer: Commercial Managed Care - HMO

## 2015-04-02 ENCOUNTER — Encounter (HOSPITAL_COMMUNITY): Payer: Self-pay | Admitting: Emergency Medicine

## 2015-04-02 ENCOUNTER — Inpatient Hospital Stay (HOSPITAL_COMMUNITY)
Admission: EM | Admit: 2015-04-02 | Discharge: 2015-04-04 | DRG: 380 | Disposition: A | Discharge status: Home / Self Care | Payer: Commercial Managed Care - HMO | Attending: Internal Medicine | Admitting: Internal Medicine

## 2015-04-02 ENCOUNTER — Encounter (HOSPITAL_COMMUNITY)
Admission: EM | Disposition: A | Discharge status: Home / Self Care | Payer: Self-pay | Source: Home / Self Care | Attending: Internal Medicine

## 2015-04-02 DIAGNOSIS — R1310 Dysphagia, unspecified: Secondary | ICD-10-CM | POA: Diagnosis present

## 2015-04-02 DIAGNOSIS — K269 Duodenal ulcer, unspecified as acute or chronic, without hemorrhage or perforation: Secondary | ICD-10-CM | POA: Diagnosis present

## 2015-04-02 DIAGNOSIS — Z79899 Other long term (current) drug therapy: Secondary | ICD-10-CM

## 2015-04-02 DIAGNOSIS — R069 Unspecified abnormalities of breathing: Secondary | ICD-10-CM | POA: Diagnosis not present

## 2015-04-02 DIAGNOSIS — K264 Chronic or unspecified duodenal ulcer with hemorrhage: Secondary | ICD-10-CM | POA: Diagnosis not present

## 2015-04-02 DIAGNOSIS — A419 Sepsis, unspecified organism: Secondary | ICD-10-CM | POA: Diagnosis not present

## 2015-04-02 DIAGNOSIS — K920 Hematemesis: Secondary | ICD-10-CM

## 2015-04-02 DIAGNOSIS — D696 Thrombocytopenia, unspecified: Secondary | ICD-10-CM | POA: Diagnosis present

## 2015-04-02 DIAGNOSIS — N189 Chronic kidney disease, unspecified: Secondary | ICD-10-CM | POA: Diagnosis not present

## 2015-04-02 DIAGNOSIS — K210 Gastro-esophageal reflux disease with esophagitis: Secondary | ICD-10-CM | POA: Diagnosis present

## 2015-04-02 DIAGNOSIS — K754 Autoimmune hepatitis: Secondary | ICD-10-CM

## 2015-04-02 DIAGNOSIS — D631 Anemia in chronic kidney disease: Secondary | ICD-10-CM | POA: Diagnosis present

## 2015-04-02 DIAGNOSIS — K219 Gastro-esophageal reflux disease without esophagitis: Secondary | ICD-10-CM | POA: Diagnosis not present

## 2015-04-02 DIAGNOSIS — Z8249 Family history of ischemic heart disease and other diseases of the circulatory system: Secondary | ICD-10-CM | POA: Diagnosis not present

## 2015-04-02 DIAGNOSIS — Z7952 Long term (current) use of systemic steroids: Secondary | ICD-10-CM | POA: Diagnosis not present

## 2015-04-02 DIAGNOSIS — Z8719 Personal history of other diseases of the digestive system: Secondary | ICD-10-CM

## 2015-04-02 DIAGNOSIS — Z7982 Long term (current) use of aspirin: Secondary | ICD-10-CM | POA: Diagnosis not present

## 2015-04-02 DIAGNOSIS — B1920 Unspecified viral hepatitis C without hepatic coma: Secondary | ICD-10-CM | POA: Diagnosis present

## 2015-04-02 DIAGNOSIS — K2211 Ulcer of esophagus with bleeding: Principal | ICD-10-CM | POA: Diagnosis present

## 2015-04-02 DIAGNOSIS — Q394 Esophageal web: Secondary | ICD-10-CM | POA: Diagnosis not present

## 2015-04-02 DIAGNOSIS — D500 Iron deficiency anemia secondary to blood loss (chronic): Secondary | ICD-10-CM | POA: Diagnosis not present

## 2015-04-02 DIAGNOSIS — K922 Gastrointestinal hemorrhage, unspecified: Secondary | ICD-10-CM | POA: Diagnosis present

## 2015-04-02 DIAGNOSIS — D62 Acute posthemorrhagic anemia: Secondary | ICD-10-CM | POA: Diagnosis not present

## 2015-04-02 DIAGNOSIS — D72829 Elevated white blood cell count, unspecified: Secondary | ICD-10-CM | POA: Diagnosis present

## 2015-04-02 DIAGNOSIS — D638 Anemia in other chronic diseases classified elsewhere: Secondary | ICD-10-CM

## 2015-04-02 DIAGNOSIS — E875 Hyperkalemia: Secondary | ICD-10-CM | POA: Diagnosis present

## 2015-04-02 DIAGNOSIS — E872 Acidosis, unspecified: Secondary | ICD-10-CM | POA: Diagnosis present

## 2015-04-02 DIAGNOSIS — N183 Chronic kidney disease, stage 3 unspecified: Secondary | ICD-10-CM | POA: Diagnosis present

## 2015-04-02 DIAGNOSIS — I129 Hypertensive chronic kidney disease with stage 1 through stage 4 chronic kidney disease, or unspecified chronic kidney disease: Secondary | ICD-10-CM | POA: Diagnosis present

## 2015-04-02 DIAGNOSIS — R0602 Shortness of breath: Secondary | ICD-10-CM | POA: Diagnosis not present

## 2015-04-02 DIAGNOSIS — R112 Nausea with vomiting, unspecified: Secondary | ICD-10-CM | POA: Diagnosis not present

## 2015-04-02 DIAGNOSIS — R634 Abnormal weight loss: Secondary | ICD-10-CM | POA: Diagnosis present

## 2015-04-02 DIAGNOSIS — R110 Nausea: Secondary | ICD-10-CM

## 2015-04-02 HISTORY — DX: Autoimmune hepatitis: K75.4

## 2015-04-02 HISTORY — PX: ESOPHAGOGASTRODUODENOSCOPY: SHX5428

## 2015-04-02 HISTORY — DX: Disorder of kidney and ureter, unspecified: N28.9

## 2015-04-02 LAB — COMPREHENSIVE METABOLIC PANEL
ALT: 104 U/L — AB (ref 17–63)
ALT: 97 U/L — AB (ref 17–63)
ANION GAP: 10 (ref 5–15)
AST: 56 U/L — AB (ref 15–41)
AST: 61 U/L — ABNORMAL HIGH (ref 15–41)
Albumin: 2.2 g/dL — ABNORMAL LOW (ref 3.5–5.0)
Albumin: 2 g/dL — ABNORMAL LOW (ref 3.5–5.0)
Alkaline Phosphatase: 50 U/L (ref 38–126)
Alkaline Phosphatase: 45 U/L (ref 38–126)
Anion gap: 14 (ref 5–15)
BILIRUBIN TOTAL: 0.9 mg/dL (ref 0.3–1.2)
BUN: 85 mg/dL — AB (ref 6–20)
BUN: 69 mg/dL — ABNORMAL HIGH (ref 6–20)
CHLORIDE: 110 mmol/L (ref 101–111)
CHLORIDE: 116 mmol/L — AB (ref 101–111)
CO2: 18 mmol/L — ABNORMAL LOW (ref 22–32)
CO2: 18 mmol/L — AB (ref 22–32)
CREATININE: 2.33 mg/dL — AB (ref 0.61–1.24)
Calcium: 8.2 mg/dL — ABNORMAL LOW (ref 8.9–10.3)
Calcium: 7 mg/dL — ABNORMAL LOW (ref 8.9–10.3)
Creatinine, Ser: 1.92 mg/dL — ABNORMAL HIGH (ref 0.61–1.24)
GFR calc Af Amer: 29 mL/min — ABNORMAL LOW (ref >60)
GFR calc non Af Amer: 32 mL/min — ABNORMAL LOW (ref >60)
GFR, EST AFRICAN AMERICAN: 37 mL/min — AB (ref >60)
GFR, EST NON AFRICAN AMERICAN: 25 mL/min — AB (ref >60)
GLUCOSE: 127 mg/dL — AB (ref 65–99)
Glucose, Bld: 104 mg/dL — ABNORMAL HIGH (ref 65–99)
POTASSIUM: 5.4 mmol/L — AB (ref 3.5–5.1)
Potassium: 5.3 mmol/L — ABNORMAL HIGH (ref 3.5–5.1)
SODIUM: 144 mmol/L (ref 135–145)
Sodium: 142 mmol/L (ref 135–145)
Total Bilirubin: 1 mg/dL (ref 0.3–1.2)
Total Protein: 4.7 g/dL — ABNORMAL LOW (ref 6.5–8.1)
Total Protein: 4.3 g/dL — ABNORMAL LOW (ref 6.5–8.1)

## 2015-04-02 LAB — CBC
HCT: 19.8 % — ABNORMAL LOW (ref 39.0–52.0)
Hemoglobin: 6.7 g/dL — CL (ref 13.0–17.0)
MCH: 34.4 pg — ABNORMAL HIGH (ref 26.0–34.0)
MCHC: 33.8 g/dL (ref 30.0–36.0)
MCV: 101.5 fL — AB (ref 78.0–100.0)
PLATELETS: 107 10*3/uL — AB (ref 150–400)
RBC: 1.95 MIL/uL — ABNORMAL LOW (ref 4.22–5.81)
RDW: 16.8 % — AB (ref 11.5–15.5)
WBC: 17.9 10*3/uL — AB (ref 4.0–10.5)

## 2015-04-02 LAB — I-STAT CHEM 8, ED
BUN: 85 mg/dL — ABNORMAL HIGH (ref 6–20)
CALCIUM ION: 1.1 mmol/L — AB (ref 1.13–1.30)
Chloride: 109 mmol/L (ref 101–111)
Creatinine, Ser: 2.1 mg/dL — ABNORMAL HIGH (ref 0.61–1.24)
GLUCOSE: 118 mg/dL — AB (ref 65–99)
HEMATOCRIT: 18 % — AB (ref 39.0–52.0)
Hemoglobin: 6.1 g/dL — CL (ref 13.0–17.0)
Potassium: 5.1 mmol/L (ref 3.5–5.1)
SODIUM: 140 mmol/L (ref 135–145)
TCO2: 17 mmol/L (ref 0–100)

## 2015-04-02 LAB — PROTIME-INR
INR: 1.33 (ref 0.00–1.49)
PROTHROMBIN TIME: 16.6 s — AB (ref 11.6–15.2)

## 2015-04-02 LAB — POC OCCULT BLOOD, ED: Fecal Occult Bld: POSITIVE — AB

## 2015-04-02 LAB — URINALYSIS, ROUTINE W REFLEX MICROSCOPIC
BILIRUBIN URINE: NEGATIVE
Glucose, UA: NEGATIVE mg/dL
Hgb urine dipstick: NEGATIVE
KETONES UR: NEGATIVE mg/dL
LEUKOCYTES UA: NEGATIVE
NITRITE: NEGATIVE
PH: 5.5 (ref 5.0–8.0)
Protein, ur: NEGATIVE mg/dL
Specific Gravity, Urine: 1.012 (ref 1.005–1.030)

## 2015-04-02 LAB — MRSA PCR SCREENING: MRSA BY PCR: NEGATIVE

## 2015-04-02 LAB — I-STAT CG4 LACTIC ACID, ED: LACTIC ACID, VENOUS: 6.69 mmol/L — AB (ref 0.5–2.0)

## 2015-04-02 LAB — PREPARE RBC (CROSSMATCH)

## 2015-04-02 LAB — PROCALCITONIN: Procalcitonin: 3.32 ng/mL

## 2015-04-02 LAB — MAGNESIUM: Magnesium: 1.9 mg/dL (ref 1.7–2.4)

## 2015-04-02 LAB — LACTIC ACID, PLASMA: Lactic Acid, Venous: 3.8 mmol/L (ref 0.5–2.0)

## 2015-04-02 LAB — ABO/RH: ABO/RH(D): B POS

## 2015-04-02 SURGERY — EGD (ESOPHAGOGASTRODUODENOSCOPY)
Anesthesia: Monitor Anesthesia Care

## 2015-04-02 MED ORDER — FENTANYL CITRATE (PF) 100 MCG/2ML IJ SOLN
INTRAMUSCULAR | Status: DC | PRN
  Administered 2015-04-02: 50 ug via INTRAVENOUS

## 2015-04-02 MED ORDER — PIPERACILLIN-TAZOBACTAM 3.375 G IVPB 30 MIN
3.3750 g | Freq: Once | INTRAVENOUS | Status: AC
Start: 2015-04-02 — End: 2015-04-02
  Administered 2015-04-02: 3.375 g via INTRAVENOUS
  Filled 2015-04-02: qty 50

## 2015-04-02 MED ORDER — ACETAMINOPHEN 650 MG RE SUPP
650.0000 mg | Freq: Four times a day (QID) | RECTAL | Status: DC | PRN
Start: 2015-04-02 — End: 2015-04-04

## 2015-04-02 MED ORDER — LIDOCAINE HCL (CARDIAC) 20 MG/ML IV SOLN
INTRAVENOUS | Status: AC
  Filled 2015-04-02: qty 5

## 2015-04-02 MED ORDER — FENTANYL CITRATE (PF) 100 MCG/2ML IJ SOLN
INTRAMUSCULAR | Status: AC
  Filled 2015-04-02: qty 2

## 2015-04-02 MED ORDER — SODIUM CHLORIDE 0.9 % IV SOLN: INTRAVENOUS | Status: DC

## 2015-04-02 MED ORDER — SODIUM CHLORIDE 0.9% FLUSH
3.0000 mL | Freq: Two times a day (BID) | INTRAVENOUS | Status: DC
  Administered 2015-04-02 – 2015-04-03 (×2): 3 mL via INTRAVENOUS

## 2015-04-02 MED ORDER — VANCOMYCIN HCL IN DEXTROSE 1-5 GM/200ML-% IV SOLN
1000.0000 mg | Freq: Once | INTRAVENOUS | Status: AC
Start: 2015-04-02 — End: 2015-04-02
  Administered 2015-04-02: 1000 mg via INTRAVENOUS
  Filled 2015-04-02: qty 200

## 2015-04-02 MED ORDER — LACTATED RINGERS IV SOLN: INTRAVENOUS | Status: DC

## 2015-04-02 MED ORDER — PROPOFOL 10 MG/ML IV BOLUS
INTRAVENOUS | Status: DC | PRN
  Administered 2015-04-02: 150 mg via INTRAVENOUS

## 2015-04-02 MED ORDER — SODIUM CHLORIDE 0.9 % IV SOLN
8.0000 mg/h | INTRAVENOUS | Status: DC
  Administered 2015-04-02 – 2015-04-04 (×5): 8 mg/h via INTRAVENOUS
  Filled 2015-04-02 (×11): qty 80

## 2015-04-02 MED ORDER — PHENYLEPHRINE HCL 10 MG/ML IJ SOLN
INTRAMUSCULAR | Status: DC | PRN
  Administered 2015-04-02 (×3): 80 ug via INTRAVENOUS

## 2015-04-02 MED ORDER — PROPOFOL 10 MG/ML IV BOLUS
INTRAVENOUS | Status: AC
  Filled 2015-04-02: qty 20

## 2015-04-02 MED ORDER — DEXAMETHASONE SODIUM PHOSPHATE 10 MG/ML IJ SOLN
INTRAMUSCULAR | Status: AC
  Filled 2015-04-02: qty 1

## 2015-04-02 MED ORDER — PROMETHAZINE HCL 25 MG/ML IJ SOLN: 6.2500 mg | INTRAMUSCULAR | Status: DC | PRN

## 2015-04-02 MED ORDER — PREDNISONE 20 MG PO TABS
20.0000 mg | ORAL_TABLET | Freq: Every day | ORAL | Status: DC
  Administered 2015-04-03 – 2015-04-04 (×2): 20 mg via ORAL
  Filled 2015-04-02 (×2): qty 1

## 2015-04-02 MED ORDER — SODIUM CHLORIDE 0.9 % IV SOLN
INTRAVENOUS | Status: DC
  Administered 2015-04-04: 03:00:00 via INTRAVENOUS

## 2015-04-02 MED ORDER — MIDAZOLAM HCL 2 MG/2ML IJ SOLN
INTRAMUSCULAR | Status: AC
  Filled 2015-04-02: qty 2

## 2015-04-02 MED ORDER — ACETAMINOPHEN 325 MG PO TABS: 650.0000 mg | ORAL_TABLET | Freq: Four times a day (QID) | ORAL | Status: DC | PRN

## 2015-04-02 MED ORDER — ONDANSETRON HCL 4 MG PO TABS: 4.0000 mg | ORAL_TABLET | Freq: Four times a day (QID) | ORAL | Status: DC | PRN

## 2015-04-02 MED ORDER — LIDOCAINE HCL (CARDIAC) 20 MG/ML IV SOLN
INTRAVENOUS | Status: DC | PRN
  Administered 2015-04-02: 100 mg via INTRAVENOUS

## 2015-04-02 MED ORDER — DEXAMETHASONE SODIUM PHOSPHATE 10 MG/ML IJ SOLN
INTRAMUSCULAR | Status: DC | PRN
  Administered 2015-04-02: 10 mg via INTRAVENOUS

## 2015-04-02 MED ORDER — SODIUM CHLORIDE 0.9 % IV SOLN
INTRAVENOUS | Status: DC
  Administered 2015-04-02 (×2): via INTRAVENOUS

## 2015-04-02 MED ORDER — VANCOMYCIN HCL 500 MG IV SOLR
500.0000 mg | INTRAVENOUS | Status: DC
  Filled 2015-04-02: qty 500

## 2015-04-02 MED ORDER — ONDANSETRON HCL 4 MG/2ML IJ SOLN: 4.0000 mg | Freq: Four times a day (QID) | INTRAMUSCULAR | Status: DC | PRN

## 2015-04-02 MED ORDER — MIDAZOLAM HCL 5 MG/5ML IJ SOLN
INTRAMUSCULAR | Status: DC | PRN
  Administered 2015-04-02 (×2): 1 mg via INTRAVENOUS

## 2015-04-02 MED ORDER — PANTOPRAZOLE SODIUM 40 MG IV SOLR: 40.0000 mg | Freq: Two times a day (BID) | INTRAVENOUS | Status: DC

## 2015-04-02 MED ORDER — FENTANYL CITRATE (PF) 100 MCG/2ML IJ SOLN: 25.0000 ug | INTRAMUSCULAR | Status: DC | PRN

## 2015-04-02 MED ORDER — PIPERACILLIN-TAZOBACTAM 3.375 G IVPB
3.3750 g | Freq: Three times a day (TID) | INTRAVENOUS | Status: DC
  Administered 2015-04-02 – 2015-04-03 (×2): 3.375 g via INTRAVENOUS
  Filled 2015-04-02 (×7): qty 50

## 2015-04-02 MED ORDER — SUCCINYLCHOLINE CHLORIDE 20 MG/ML IJ SOLN
INTRAMUSCULAR | Status: DC | PRN
  Administered 2015-04-02: 100 mg via INTRAVENOUS

## 2015-04-02 MED ORDER — PHENYLEPHRINE 40 MCG/ML (10ML) SYRINGE FOR IV PUSH (FOR BLOOD PRESSURE SUPPORT)
PREFILLED_SYRINGE | INTRAVENOUS | Status: AC
  Filled 2015-04-02: qty 10

## 2015-04-02 MED ORDER — ONDANSETRON HCL 4 MG/2ML IJ SOLN
INTRAMUSCULAR | Status: DC | PRN
  Administered 2015-04-02: 4 mg via INTRAVENOUS

## 2015-04-02 MED ORDER — ONDANSETRON HCL 4 MG/2ML IJ SOLN
INTRAMUSCULAR | Status: AC
  Filled 2015-04-02: qty 2

## 2015-04-02 MED ORDER — SODIUM CHLORIDE 0.9 % IV SOLN
80.0000 mg | Freq: Once | INTRAVENOUS | Status: AC
  Administered 2015-04-02: 80 mg via INTRAVENOUS
  Filled 2015-04-02: qty 80

## 2015-04-02 MED ORDER — SODIUM CHLORIDE 0.9 % IV SOLN: Freq: Once | INTRAVENOUS | Status: DC

## 2015-04-02 NOTE — Consult Note (Signed)
[facility] CONSULT Reason for consult: Hematemesis Referring Physician: ER. PCP: Dr. Radene Ou. Primary G.I.: Dr. Gillie Manners James Hill is an 82 y.o. male.  HPI: he was admitted to the hospital 3/7 with six-week history of odynophagia and dysphagia. This began after he was started on prednisone for autoimmune hepatitis by Dr. Paulita Fujita. He had thrush and had been given nystatin as well. He had a 20 pound weight loss. He has a history of hepatitis C followed by Dr. Paulita Fujita. He came in with this history and this resulted in EGD by Dr. Paulita Fujita 3/8. He had severe ulceration of the distal esophagus and the scope was not passed any further. He had confluence ulcerations. It was quite probable and have bleeding with the scope touching it in the remainder of the upper G.I. tract was not evaluated. The patient was discharged home 3 days later after taking a soft diet. According to the patient and his family was with him here for the past 5 days he's been doing well but he notes that his stools have been dark for approximately 2 to 3 days. This morning he vomited up some dark material in a small amount of bright red blood. He has had an acute drop hemoglobin from 10.7 on 3/10 to 6.1 today. He denies any abdominal pain or the use of any NSAIDs.  Past Medical History  Diagnosis Date  . Hepatitis C   . Renal disorder     Past Surgical History  Procedure Laterality Date  . Esophagoscopy  03/25/2015    Procedure: ESOPHAGOSCOPY;  Surgeon: Teena Irani, MD;  Location: [facility];  Service: Endoscopy;;    No family history on file.  Social History:  reports that he has never smoked. He does not have any smokeless tobacco history on file. He reports that he drinks alcohol. His drug history is not on file.  Allergies: No Known Allergies  Medications; Prior to Admission medications   Medication Sig Start Date End Date Taking? Authorizing Provider  amLODipine (NORVASC) 5 MG tablet Take 5 mg by mouth daily.     Historical Provider, MD  aspirin EC 81 MG tablet Take 81 mg by mouth daily.    Historical Provider, MD  feeding supplement, ENSURE ENLIVE, (ENSURE ENLIVE) LIQD Take 237 mLs by mouth 3 (three) times daily. 03/27/15   Modena Jansky, MD  fluconazole (DIFLUCAN) 200 MG tablet Take 1 tablet (200 mg total) by mouth daily. 03/27/15   Modena Jansky, MD  pantoprazole (PROTONIX) 40 MG tablet Take 1 tablet (40 mg total) by mouth 2 (two) times daily before a meal. 03/27/15   Modena Jansky, MD  predniSONE (DELTASONE) 10 MG tablet Take 2 tablets (20 mg total) by mouth daily with breakfast. 03/28/15   Modena Jansky, MD     PRN Meds  Results for orders placed or performed during the hospital encounter of 04/02/15 (from the past 48 hour(s))  CBC     Status: Abnormal (Preliminary result)   Collection Time: 04/02/15  9:05 AM  Result Value Ref Range   WBC 17.9 (H) 4.0 - 10.5 K/uL   RBC 1.95 (L) 4.22 - 5.81 MIL/uL   Hemoglobin 6.7 (LL) 13.0 - 17.0 g/dL    Comment: CRITICAL RESULT CALLED TO, READ BACK BY AND VERIFIED WITH: Marisa Hua RN AT [phone] ON 03.16.17 BY SHUEA    HCT 19.8 (L) 39.0 - 52.0 %   MCV 101.5 (H) 78.0 - 100.0 fL   MCH 34.4 (H) 26.0 -  34.0 pg   MCHC 33.8 30.0 - 36.0 g/dL   RDW 16.8 (H) 11.5 - 15.5 %   Platelets PENDING 150 - 400 K/uL  POC occult blood, ED     Status: Abnormal   Collection Time: 04/02/15  9:09 AM  Result Value Ref Range   Fecal Occult Bld POSITIVE (A) NEGATIVE  I-Stat Chem 8, ED     Status: Abnormal   Collection Time: 04/02/15  9:13 AM  Result Value Ref Range   Sodium 140 135 - 145 mmol/L   Potassium 5.1 3.5 - 5.1 mmol/L   Chloride 109 101 - 111 mmol/L   BUN 85 (H) 6 - 20 mg/dL   Creatinine, Ser 2.10 (H) 0.61 - 1.24 mg/dL   Glucose, Bld 118 (H) 65 - 99 mg/dL   Calcium, Ion 1.10 (L) 1.13 - 1.30 mmol/L   TCO2 17 0 - 100 mmol/L   Hemoglobin 6.1 (LL) 13.0 - 17.0 g/dL   HCT 18.0 (L) 39.0 - 52.0 %   Comment NOTIFIED PHYSICIAN     Dg Chest Portable 1  View  04/02/2015  CLINICAL DATA:  Shortness of breath for several days EXAM: PORTABLE CHEST 1 VIEW COMPARISON:  None. FINDINGS: Lungs are clear. Heart size and pulmonary vascularity are normal. No adenopathy. No bone lesions. IMPRESSION: No edema or consolidation. Electronically Signed   By: Lowella Grip III M.D.   On: 04/02/2015 09:17               Blood pressure 142/98, pulse 115, temperature 96.6 F (35.9 C), temperature source Rectal, resp. rate 24, SpO2 98 %.  Physical exam:   General--Pleasant African-American male no acute distress  ENT--nonicteric  Neck--no lymphadenopathy  Heart--regular rate and rhythm without murmurs are gallops  Lungs--clear  Abdomen--soft and completely nontender  Psych--alert and oriented and answers questions appropriately   Assessment: 1. Upper G.I. bleed. Probably due to ulcerations of his esophagus. His stomach could not be examined during his recent EGD to severe ulceration of the esophagus so it is possible he could have a lesion further down in the upper G.I. tract. He has had a significant drop in hemoglobin. 2. History of hepatitis C and autoimmune hepatitis followed by Dr. Paulita Fujita 3. Recent anorexia and weight loss of unclear etiology  Plan: 1. Agree with admission and transfusion 2. Will keep NPO for now and hopefully he will be able to have EGD by Dr. Amedeo Plenty later today 3. IV Protonix for now   Robbyn Hodkinson JR,Yaron Grasse L 04/02/2015, 9:35 AM   This note was created using voice recognition software and minor errors may Have occurred unintentionally. Pager: [phone] If no answer or after hours call [phone]

## 2015-04-02 NOTE — Anesthesia Postprocedure Evaluation (Signed)
Anesthesia Post Note  Patient: James Hill  Procedure(s) Performed: Procedure(s) (LRB): ESOPHAGOGASTRODUODENOSCOPY (EGD) (N/A)  Patient location during evaluation: PACU Anesthesia Type: General Level of consciousness: awake and alert Pain management: pain level controlled Vital Signs Assessment: post-procedure vital signs reviewed and stable Respiratory status: spontaneous breathing, nonlabored ventilation, respiratory function stable and patient connected to nasal cannula oxygen Cardiovascular status: blood pressure returned to baseline and stable Postop Assessment: no signs of nausea or vomiting Anesthetic complications: no    Last Vitals:  Filed Vitals:   04/02/15 1330 04/02/15 1353  BP: 114/68 124/62  Pulse: 94 106  Temp: 36.4 C 36.4 C  Resp: 12 16    Last Pain:  Filed Vitals:   04/02/15 1355  PainSc: 0-No pain                 Sokhna Christoph S

## 2015-04-02 NOTE — Progress Notes (Signed)
Patient without twitches for approximately 50 minutes. 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

## 2015-04-02 NOTE — H&P (Addendum)
[facility] History and Physical  James Hill VQQ:[id number] DOB: 1933/04/27 DOA: 04/02/2015  Referring physician: ER physician: Dr. Davonna Belling  PCP: Aretta Nip, MD  Chief Complaint: shortness of breath   HPI:  82 year old male with recent diagnosis and hospitalization for autoimmune hepatitis, he was seen by GI at that time. He had endoscopy 03/25/2015 significant for severe erosive reflux esophagitis. At the time of discharge he was supposed to finish fluconazole and he was also on prednisone 20 mg daily. Patient also has history of chronic kidney disease stage III. Patient reported to [facility] today with main concern for ongoing shortness of breath for last couple of days, weakness and fatigue. He did not have any gross bleed. Normal stool color per patient. He did report having nausea and bloody vomiting prior to the admission. No fevers or chills. No chest pain or palpitations. No reports of lightheadedness or loss of consciousness. No urinary complaints.  In ED, blood pressure was 97/48. At this time he is receiving second IV 1 liter bolus. His blood pressure is 133/84. Temp lowest was 96.6 F, HR 120, RR 24. Blood work was notable for hemoglobin of 6.7 and a repeat level VI.1, white blood cell count 17.9, platelets 107, potassium 5.3 and repeat level V.1. Creatinine was 2.33. Lactic acid 6.69. Chest x-ray showed no acute cardiopulmonary findings. Urinalysis is unremarkable. GI was consulted and will see the patient in consultation. Meanwhile, patient started on Protonix drip and Protonix 40 mg IV every 12 hours initiated.  Patient admitted to step down unit to monitor hemodynamic stability.  Assessment & Plan    Principal problem: Acute blood loss anemia / symptomatic anemia / anemia of chronic renal failure - Anemia likely combination of patient's history of esophagitis and chronic renal disease - Patient is on aspirin which is likely triggering agent for  acute blood loss anemia so we placed aspirin on hold - Started IV fluids, normal saline at 75 mL an hour - Patient will get 2 units of PRBC transfusion - Start Protonix drip and Protonix 40 mg IV every 12 hours - Appreciate the GI consult and recommendations - Admission to stepdown unit  Active problems: Hematemesis - Likely from reflux esophagitis - Started Protonix drip and Protonix 40 mg IV every 12 hours - Started Zofran 4 mg IV every 6 hours as needed - Keep nothing by mouth - Appreciate the GI recommendations - Continue supportive care with IV fluids  Sepsis, unspecified organism - Sepsis criteria met on admission with hypothermia, initial hypotension, tachycardia, tachypnea, lactic acidosis, leukocytosis - No obvious source of infection at this time - Sepsis order set placed - Follow-up pro-calcitonin level, repeat lactic acid level - Follow-up blood culture results - Urinalysis and chest x-ray unremarkable - Order placed for vancomycin and Zosyn empirically until blood culture results available  Hyperkalemia - Potassium 5.3 on the admission - Repeat potassium 5.1  CKD stage III - Baseline creatinine since last admission is 3 - Creatinine on this admission 2.33 - Continue to monitor renal function  Weight loss - Patient had reports of weight loss on recent admission. He is currently 125 pounds which seems to be the weight prior to discharge. - Consult nutritionist - Patient was on steroids at the time of discharge - Will resume steroids starting tomorrow if okay with GI - It is reasonable to check a cortisol level since patient missed a few days of prednisone because of vomiting.  Autoimmune Hepatitis - Recent liver biopsy  showed chronic active hepatitis, on steroids. - Recent acute hepatitis panel negative.  Thrombocytopenia - Likely secondary to bone marrow suppression from autoimmune hepatitis - Platelets are 107, continue to monitor platelet count  daily  Essential hypertension - Patient reported he does not take Norvasc. His blood pressure is stable, 125/58. - Monitor on telemetry   DVT prophylaxis:  - SCD's bilaterally due to risk of bleeding and active bleeding   Radiological Exams on Admission: Dg Chest Portable 1 View 04/02/2015   No edema or consolidation. Electronically Signed   By: Lowella Grip III M.D.   On: 04/02/2015 09:17    EKG: I have personally reviewed EKG. EKG shows sinus tachycardia  Code Status: Full Family Communication: Plan of care discussed with the patient and his daughter at the bedside Disposition Plan: Admit for further evaluation, step down unit admission because of hemodynamic instability, hypotension, acute blood loss anemia, lactic acidosis and possible early sepsis  [name], MD  [facility] Pager [phone]  Time spent in minutes: 75 minutes  Review of Systems:  Constitutional: Negative for fever, chills and malaise/fatigue. Negative for diaphoresis.  HENT: Negative for hearing loss, ear pain, nosebleeds, congestion, sore throat, neck pain, tinnitus and ear discharge.   Eyes: Negative for blurred vision, double vision, photophobia, pain, discharge and redness.  Respiratory: Per history of present illness Cardiovascular: Negative for chest pain, palpitations, orthopnea, claudication and leg swelling.  Gastrointestinal: Negative for nausea, vomiting and abdominal pain. Negative for heartburn, constipation, blood in stool and melena.  Genitourinary: Negative for dysuria, urgency, frequency, hematuria and flank pain.  Musculoskeletal: Negative for myalgias, back pain, joint pain and falls.  Skin: Negative for itching and rash.  Neurological: Negative for dizziness and weakness. Negative for tingling, tremors, sensory change, speech change, focal weakness, loss of consciousness and headaches.  Endo/Heme/Allergies: Negative for environmental allergies and polydipsia. Does not  bruise/bleed easily.  Psychiatric/Behavioral: Negative for suicidal ideas. The patient is not nervous/anxious.      Past Medical History  Diagnosis Date  . Hepatitis C   . Renal disorder    Past Surgical History  Procedure Laterality Date  . Esophagoscopy  03/25/2015    Procedure: ESOPHAGOSCOPY;  Surgeon: Teena Irani, MD;  Location: [facility] ENDOSCOPY;  Service: Endoscopy;;   Social History:  reports that he has never smoked. He does not have any smokeless tobacco history on file. He reports that he drinks alcohol. His drug history is not on file.  No Known Allergies  Family History: Hypertension in mother   Prior to Admission medications   Medication Sig Start Date End Date Taking? Authorizing Provider  amLODipine (NORVASC) 5 MG tablet Take 5 mg by mouth daily.    Historical Provider, MD  aspirin EC 81 MG tablet Take 81 mg by mouth daily.    Historical Provider, MD  feeding supplement, ENSURE ENLIVE, (ENSURE ENLIVE) LIQD Take 237 mLs by mouth 3 (three) times daily. 03/27/15   Modena Jansky, MD  fluconazole (DIFLUCAN) 200 MG tablet Take 1 tablet (200 mg total) by mouth daily. 03/27/15   Modena Jansky, MD  pantoprazole (PROTONIX) 40 MG tablet Take 1 tablet (40 mg total) by mouth 2 (two) times daily before a meal. 03/27/15   Modena Jansky, MD  predniSONE (DELTASONE) 10 MG tablet Take 2 tablets (20 mg total) by mouth daily with breakfast. 03/28/15   Modena Jansky, MD   Physical Exam: Filed Vitals:   04/02/15 2836 04/02/15 0840 04/02/15 0920  BP: 97/48  142/98  Pulse: 107  115  Temp:  96.6 F (35.9 C)   TempSrc:  Rectal   Resp: 24  24  SpO2:  100% 98%    Physical Exam  Constitutional: Appears well-developed and well-nourished. No distress.  HENT: Normocephalic. No tonsillar erythema or exudates Eyes: Conjunctivae are normal. No scleral icterus.  Neck: Normal ROM. Neck supple. No JVD. No tracheal deviation. No thyromegaly.  CVS: RRR, S1/S2 appreciated, no murmurs  Pulmonary:  Effort and breath sounds normal, no stridor, rhonchi, wheezes, rales.  Abdominal: Soft. BS +,  no distension, tenderness, rebound or guarding.  Musculoskeletal: Normal range of motion. No edema and no tenderness.  Lymphadenopathy: No lymphadenopathy noted, cervical, inguinal. Neuro: Alert. Normal reflexes, muscle tone coordination. No focal neurologic deficits. Skin: Skin is warm and dry. No rash noted.  No erythema. No pallor.  Psychiatric: Normal mood and affect. Behavior, judgment, thought content normal.   Labs on Admission:  Basic Metabolic Panel:  Recent Labs Lab 03/27/15 0319 04/02/15 0913  NA 139 140  K 4.8 5.1  CL 110 109  CO2 19*  --   GLUCOSE 136* 118*  BUN 28* 85*  CREATININE 2.03* 2.10*  CALCIUM 7.6*  --    Liver Function Tests:  Recent Labs Lab 03/27/15 0319  AST 79*  ALT 208*  ALKPHOS 58  BILITOT 1.0  PROT 5.2*  ALBUMIN 2.4*   No results for input(s): LIPASE, AMYLASE in the last 168 hours. No results for input(s): AMMONIA in the last 168 hours. CBC:  Recent Labs Lab 03/27/15 0319 04/02/15 0905 04/02/15 0913  WBC 8.6 17.9*  --   HGB 10.0* 6.7* 6.1*  HCT 28.6* 19.8* 18.0*  MCV 92.3 101.5*  --   PLT 84* PENDING  --    Cardiac Enzymes: No results for input(s): CKTOTAL, CKMB, CKMBINDEX, TROPONINI in the last 168 hours. BNP: Invalid input(s): POCBNP CBG: No results for input(s): GLUCAP in the last 168 hours.  If 7PM-7AM, please contact night-coverage [email or web address] Password [facility] 04/02/2015, 9:41 AM

## 2015-04-02 NOTE — Anesthesia Procedure Notes (Signed)
Procedure Name: Intubation Date/Time: 04/02/2015 11:31 AM Performed by: Lind Covert Pre-anesthesia Checklist: Patient identified, Timeout performed, Emergency Drugs available, Suction available and Patient being monitored Patient Re-evaluated:Patient Re-evaluated prior to inductionOxygen Delivery Method: Circle system utilized Preoxygenation: Pre-oxygenation with 100% oxygen Intubation Type: IV induction Ventilation: Mask ventilation without difficulty Laryngoscope Size: Mac and 4 Grade View: Grade III Tube size: 7.5 mm Number of attempts: 2 Airway Equipment and Method: Bougie stylet and Stylet Placement Confirmation: ETT inserted through vocal cords under direct vision,  breath sounds checked- equal and bilateral and positive ETCO2 Secured at: 22 cm Tube secured with: Tape Dental Injury: Teeth and Oropharynx as per pre-operative assessment

## 2015-04-02 NOTE — Anesthesia Preprocedure Evaluation (Addendum)
Anesthesia Evaluation  Patient identified by MRN, date of birth, ID band Patient awake    Reviewed: Allergy & Precautions, NPO status , Patient's Chart, lab work & pertinent test results  Airway Mallampati: II  TM Distance: >3 FB Neck ROM: Full    Dental no notable dental hx.    Pulmonary neg pulmonary ROS,    Pulmonary exam normal breath sounds clear to auscultation       Cardiovascular negative cardio ROS Normal cardiovascular exam Rhythm:Regular Rate:Normal     Neuro/Psych negative neurological ROS  negative psych ROS   GI/Hepatic negative GI ROS, GERD  Medicated,(+) Hepatitis -, C  Endo/Other  negative endocrine ROS  Renal/GU CRFRenal disease  negative genitourinary   Musculoskeletal negative musculoskeletal ROS (+)   Abdominal   Peds negative pediatric ROS (+)  Hematology negative hematology ROS (+)   Anesthesia Other Findings   Reproductive/Obstetrics negative OB ROS                            Lab Results  Component Value Date   WBC 17.9* 04/02/2015   HGB 6.1* 04/02/2015   HCT 18.0* 04/02/2015   MCV 101.5* 04/02/2015   PLT 107* 04/02/2015   Lab Results  Component Value Date   CREATININE 2.10* 04/02/2015   BUN 85* 04/02/2015   NA 140 04/02/2015   K 5.1 04/02/2015   CL 109 04/02/2015   CO2 18* 04/02/2015   Lab Results  Component Value Date   INR 1.33 04/02/2015   INR 1.21 03/23/2015   INR 1.18 02/11/2015   03/2015 EKG: sinus tachycardia.    Anesthesia Physical Anesthesia Plan  ASA: III and emergent  Anesthesia Plan: General   Post-op Pain Management:    Induction: Intravenous  Airway Management Planned: Oral ETT  Additional Equipment:   Intra-op Plan:   Post-operative Plan: Extubation in OR  Informed Consent: I have reviewed the patients History and Physical, chart, labs and discussed the procedure including the risks, benefits and alternatives for  the proposed anesthesia with the patient or authorized representative who has indicated his/her understanding and acceptance.   Dental advisory given  Plan Discussed with: CRNA  Anesthesia Plan Comments:        Anesthesia Quick Evaluation

## 2015-04-02 NOTE — ED Notes (Signed)
Pt transported to Endo, bedside handoff to transport RNs

## 2015-04-02 NOTE — ED Notes (Signed)
Pt's grand daugher [facility] [id number]

## 2015-04-02 NOTE — ED Notes (Signed)
From home via [facility], c/o weakness, SOB and has vomited dark bloody emesis, initially hypotensive and tachy, 500 ml NS from EMS, A/O X4

## 2015-04-02 NOTE — Progress Notes (Signed)
Pharmacy Antibiotic Follow-up Note  James Hill is a 82 y.o. year-old male admitted on 04/02/2015.  The patient is currently on day 1 of Vancomycin & Zosyn for r/o sepsis. CXray clear, no acute lung process.  Assessment/Plan: Vancomycin 1gm x1 in ED, followed by 500mg  q24 for trough 15-20 mcg/ml Zosyn 3.375gm q8hr - 4 hr infusion  Temp (24hrs), Avg:97.4 F (36.3 C), Min:96.6 F (35.9 C), Max:97.8 F (36.6 C)   Recent Labs Lab 03/27/15 0319 04/02/15 0905  WBC 8.6 17.9*    Recent Labs Lab 03/27/15 0319 04/02/15 0905 04/02/15 0913  CREATININE 2.03* 2.33* 2.10*   Estimated Creatinine Clearance: 22 mL/min (by C-G formula based on Cr of 2.1).    No Known Allergies  Antimicrobials this admission: 3/16 Vanc >>  3/16 Zosyn  >>   Levels/dose changes this admission:  Microbiology results: 3/16 UCx: sent   Thank you for allowing pharmacy to be a part of this patient's care.  Minda Ditto PharmD 04/02/2015 12:39 PM

## 2015-04-02 NOTE — Transfer of Care (Signed)
Immediate Anesthesia Transfer of Care Note  Patient: James Hill  Procedure(s) Performed: Procedure(s): ESOPHAGOGASTRODUODENOSCOPY (EGD) (N/A)  Patient Location: PACU  Anesthesia Type:General  Level of Consciousness: sedated and unresponsive  Airway & Oxygen Therapy: Patient remains intubated per anesthesia plan and Patient placed on Ventilator (see vital sign flow sheet for setting)  Post-op Assessment: Report given to RN  Post vital signs: Reviewed and stable  Last Vitals:  Filed Vitals:   04/02/15 1118 04/02/15 1121  BP: 129/58   Pulse:    Temp:  36.6 C  Resp: 17     Complications: [id number] no TOF after succinylcholine No spontaneous respirations.. Dr. Kalman Shan notified to PACU intuated and placed on vent. 1225 4/4 TOF

## 2015-04-02 NOTE — ED Provider Notes (Signed)
CSN: [id number]     Arrival date & time 04/02/15  [id number] History   First MD Initiated Contact with Patient 04/02/15 0830     Chief Complaint  Patient presents with  . GI Bleeding     The history is provided by the patient.  Patient presents with likely GI bleeding. EMS was called out for shortness of breath. States he's been feeling short of breath the last couple days. Recent admission to hospital for esophagitis. His history of autoimmune hepatitis. He had been on prednisone but that has been tapered down to 20 mg. He states that his home health nurse stopped his medicines except for the prednisone a couple days ago. States that he has been having black stool for the last 2 days. EMS states there was also bloody emesis that was purple with frank blood. Patient says he short of breath denies chest pain. No fevers. Recent admission to the hospital had an upper scope that showed severe esophagitis. Shortness of breath without cough or chest pain.   Past Medical History  Diagnosis Date  . Hepatitis C   . Renal disorder    Past Surgical History  Procedure Laterality Date  . Esophagoscopy  03/25/2015    Procedure: ESOPHAGOSCOPY;  Surgeon: Teena Irani, MD;  Location: [facility];  Service: Endoscopy;;   History reviewed. No pertinent family history. Social History  Substance Use Topics  . Smoking status: Never Smoker   . Smokeless tobacco: Never Used  . Alcohol Use: Yes    Review of Systems  Constitutional: Positive for fatigue. Negative for appetite change.  Respiratory: Positive for shortness of breath.   Cardiovascular: Negative for chest pain.  Gastrointestinal: Positive for blood in stool. Negative for abdominal pain.  Genitourinary: Negative for flank pain.  Musculoskeletal: Negative for back pain.  Skin: Positive for pallor. Negative for wound.  Neurological: Negative for headaches.      Allergies  Review of patient's allergies indicates no known allergies.  Home  Medications   Prior to Admission medications   Medication Sig Start Date End Date Taking? Authorizing Provider  feeding supplement, ENSURE ENLIVE, (ENSURE ENLIVE) LIQD Take 237 mLs by mouth 3 (three) times daily. 03/27/15  Yes Modena Jansky, MD  predniSONE (DELTASONE) 10 MG tablet Take 2 tablets (20 mg total) by mouth daily with breakfast. 03/28/15  Yes Modena Jansky, MD  pantoprazole (PROTONIX) 40 MG tablet Take 1 tablet (40 mg total) by mouth 2 (two) times daily before a meal. 03/27/15   Modena Jansky, MD   BP 131/67 mmHg  Pulse 90  Temp(Src) 98.1 F (36.7 C) (Oral)  Resp 26  SpO2 100% Physical Exam  Constitutional: He appears well-developed.  HENT:  Head: Atraumatic.  Dried blood in mouth.  Cardiovascular:  Mild tachycardia.   Pulmonary/Chest: Effort normal and breath sounds normal. No respiratory distress.  Abdominal: Soft. There is no tenderness.  Musculoskeletal: Normal range of motion.  Neurological: He is alert.  Skin: There is pallor.    ED Course  Procedures (including critical care time) Labs Review Labs Reviewed  COMPREHENSIVE METABOLIC PANEL - Abnormal; Notable for the following:    Potassium 5.3 (*)    CO2 18 (*)    Glucose, Bld 127 (*)    BUN 85 (*)    Creatinine, Ser 2.33 (*)    Calcium 8.2 (*)    Total Protein 4.7 (*)    Albumin 2.2 (*)    AST 56 (*)    ALT 104 (*)  GFR calc non Af Amer 25 (*)    GFR calc Af Amer 29 (*)    All other components within normal limits  CBC - Abnormal; Notable for the following:    WBC 17.9 (*)    RBC 1.95 (*)    Hemoglobin 6.7 (*)    HCT 19.8 (*)    MCV 101.5 (*)    MCH 34.4 (*)    RDW 16.8 (*)    Platelets 107 (*)    All other components within normal limits  PROTIME-INR - Abnormal; Notable for the following:    Prothrombin Time 16.6 (*)    All other components within normal limits  COMPREHENSIVE METABOLIC PANEL - Abnormal; Notable for the following:    Potassium 5.4 (*)    Chloride 116 (*)    CO2 18  (*)    Glucose, Bld 104 (*)    BUN 69 (*)    Creatinine, Ser 1.92 (*)    Calcium 7.0 (*)    Total Protein 4.3 (*)    Albumin 2.0 (*)    AST 61 (*)    ALT 97 (*)    GFR calc non Af Amer 32 (*)    GFR calc Af Amer 37 (*)    All other components within normal limits  LACTIC ACID, PLASMA - Abnormal; Notable for the following:    Lactic Acid, Venous 3.8 (*)    All other components within normal limits  POC OCCULT BLOOD, ED - Abnormal; Notable for the following:    Fecal Occult Bld POSITIVE (*)    All other components within normal limits  I-STAT CG4 LACTIC ACID, ED - Abnormal; Notable for the following:    Lactic Acid, Venous 6.69 (*)    All other components within normal limits  I-STAT CHEM 8, ED - Abnormal; Notable for the following:    BUN 85 (*)    Creatinine, Ser 2.10 (*)    Glucose, Bld 118 (*)    Calcium, Ion 1.10 (*)    Hemoglobin 6.1 (*)    HCT 18.0 (*)    All other components within normal limits  URINE CULTURE  CULTURE, BLOOD (ROUTINE X 2)  CULTURE, BLOOD (ROUTINE X 2)  MRSA PCR SCREENING  URINALYSIS, ROUTINE W REFLEX MICROSCOPIC (NOT AT [facility])  PROCALCITONIN  MAGNESIUM  CBC WITH DIFFERENTIAL/PLATELET  I-STAT CG4 LACTIC ACID, ED  TYPE AND SCREEN  PREPARE RBC (CROSSMATCH)  ABO/RH    Imaging Review Dg Chest Portable 1 View  04/02/2015  CLINICAL DATA:  Shortness of breath for several days EXAM: PORTABLE CHEST 1 VIEW COMPARISON:  None. FINDINGS: Lungs are clear. Heart size and pulmonary vascularity are normal. No adenopathy. No bone lesions. IMPRESSION: No edema or consolidation. Electronically Signed   By: Lowella Grip III M.D.   On: 04/02/2015 09:17   I have personally reviewed and evaluated these images and lab results as part of my medical decision-making.   EKG Interpretation None      MDM   Final diagnoses:  Gastrointestinal hemorrhage, unspecified gastritis, unspecified gastrointestinal hemorrhage type  Acute blood loss anemia  Autoimmune  hepatitis ([place])    Patient with severe anemia. Also GI bleed vomiting. Has known severe esophagitis with autoimmune hepatitis. Hypotension has improved. Transfused and admitted to step down with GI consult.  CRITICAL CARE Performed by: Mackie Pai Total critical care time:30 minutes Critical care time was exclusive of separately billable procedures and treating other patients. Critical care was necessary to treat or prevent imminent  or life-threatening deterioration. Critical care was time spent personally by me on the following activities: development of treatment plan with patient and/or surrogate as well as nursing, discussions with consultants, evaluation of patient's response to treatment, examination of patient, obtaining history from patient or surrogate, ordering and performing treatments and interventions, ordering and review of laboratory studies, ordering and review of radiographic studies, pulse oximetry and re-evaluation of patient's condition.  Davonna Belling, MD 04/02/15 [phone]

## 2015-04-02 NOTE — Op Note (Signed)
[facility] Patient Name: James Hill Procedure Date: 04/02/2015 MRN: [id number] Attending MD: Missy Sabins , MD Date of Birth: 06/06/1933 CSN:  Age: 82 Admit Type: Outpatient Procedure:                Upper GI endoscopy Indications:              Hematemesis Providers:                Elyse Jarvis. Amedeo Plenty, MD, Dortha Schwalbe, RN, Elspeth Cho, Technician Referring MD:              Medicines:                General Anesthesia Complications:            No immediate complications. Estimated Blood Loss:     Estimated blood loss: none. Procedure:                Pre-Anesthesia Assessment:                           - Prior to the procedure, a History and Physical                            was performed, and patient medications and                            allergies were reviewed. The patient's tolerance of                            previous anesthesia was also reviewed. The risks                            and benefits of the procedure and the sedation                            options and risks were discussed with the patient.                            All questions were answered, and informed consent                            was obtained. Prior Anticoagulants: The patient has                            taken no previous anticoagulant or antiplatelet                            agents. ASA Grade Assessment: III - A patient with                            severe systemic disease. After reviewing the risks                            and  benefits, the patient was deemed in                            satisfactory condition to undergo the procedure.                           After obtaining informed consent, the endoscope was                            passed under direct vision. Throughout the                            procedure, the patient's blood pressure, pulse, and                            oxygen saturations were monitored continuously. The                             Endoscope was introduced through the mouth, and                            advanced to the second part of duodenum. The upper                            GI endoscopy was accomplished without difficulty.                            The patient tolerated the procedure well. Scope In: Scope Out: Findings:      LA Grade A (one or more mucosal breaks less than 5 mm, not extending       between tops of 2 mucosal folds) esophagitis with no bleeding was found.      A web was found in the proximal esophagus.      The entire examined stomach was normal.      One non-bleeding superficial duodenal ulcer with no stigmata of bleeding       was found in the duodenal bulb. The lesion was 4 mm in largest dimension. Impression:               - LA Grade A non-erosive esophagitis.                           - Web in the proximal esophagus.                           - Normal stomach.                           - One non-bleeding duodenal ulcer with no stigmata                            of bleeding.                           - No specimens collected. Moderate Sedation:      no moderate sedation Recommendation:           -  Full liquid diet.                           - Use a proton pump inhibitor PO BID.                           - monitor stools and hemoglobin post transfusion                           - Continue present medications. Procedure Code(s):        --- Professional ---                           [phone], Esophagogastroduodenoscopy, flexible,                            transoral; diagnostic, including collection of                            specimen(s) by brushing or washing, when performed                            (separate procedure) Diagnosis Code(s):        --- Professional ---                           K20.8, Other esophagitis                           Q39.4, Esophageal web                           K26.9, Duodenal ulcer, unspecified as acute or                             chronic, without hemorrhage or perforation                           K92.0, Hematemesis CPT copyright 2016 [employer]. All rights reserved. The codes documented in this report are preliminary and upon coder review may  be revised to meet current compliance requirements. Teena Irani, MD Missy Sabins, MD 04/02/2015 11:52:46 AM This report has been signed electronically. Number of Addenda: 0

## 2015-04-02 NOTE — ED Notes (Signed)
texted Dr Charlies Silvers re pt's [place]

## 2015-04-02 NOTE — Progress Notes (Signed)
Anesthesia unable to extubate patient; pt transferred to PACU per Dr Kalman Shan request; Report given to Otelia Sergeant, RN

## 2015-04-02 NOTE — ED Notes (Signed)
Hemoglobin 6.7 critical value called from lab. Baxter Hire, RN made aware.

## 2015-04-03 ENCOUNTER — Encounter (HOSPITAL_COMMUNITY): Payer: Self-pay | Admitting: Gastroenterology

## 2015-04-03 DIAGNOSIS — E875 Hyperkalemia: Secondary | ICD-10-CM

## 2015-04-03 DIAGNOSIS — K754 Autoimmune hepatitis: Secondary | ICD-10-CM

## 2015-04-03 DIAGNOSIS — K922 Gastrointestinal hemorrhage, unspecified: Secondary | ICD-10-CM

## 2015-04-03 DIAGNOSIS — D62 Acute posthemorrhagic anemia: Secondary | ICD-10-CM

## 2015-04-03 LAB — BASIC METABOLIC PANEL
ANION GAP: 9 (ref 5–15)
BUN: 64 mg/dL — ABNORMAL HIGH (ref 6–20)
CHLORIDE: 115 mmol/L — AB (ref 101–111)
CO2: 21 mmol/L — AB (ref 22–32)
Calcium: 8 mg/dL — ABNORMAL LOW (ref 8.9–10.3)
Creatinine, Ser: 2.26 mg/dL — ABNORMAL HIGH (ref 0.61–1.24)
GFR calc non Af Amer: 26 mL/min — ABNORMAL LOW (ref >60)
GFR, EST AFRICAN AMERICAN: 30 mL/min — AB (ref >60)
Glucose, Bld: 134 mg/dL — ABNORMAL HIGH (ref 65–99)
Potassium: 5.3 mmol/L — ABNORMAL HIGH (ref 3.5–5.1)
Sodium: 145 mmol/L (ref 135–145)

## 2015-04-03 LAB — TYPE AND SCREEN
ABO/RH(D): B POS
ANTIBODY SCREEN: NEGATIVE
Unit division: 0
Unit division: 0

## 2015-04-03 LAB — CBC
HCT: 28.9 % — ABNORMAL LOW (ref 39.0–52.0)
HEMOGLOBIN: 10 g/dL — AB (ref 13.0–17.0)
MCH: 30.6 pg (ref 26.0–34.0)
MCHC: 34.6 g/dL (ref 30.0–36.0)
MCV: 88.4 fL (ref 78.0–100.0)
Platelets: 70 10*3/uL — ABNORMAL LOW (ref 150–400)
RBC: 3.27 MIL/uL — AB (ref 4.22–5.81)
RDW: 16.6 % — ABNORMAL HIGH (ref 11.5–15.5)
WBC: 14.3 10*3/uL — ABNORMAL HIGH (ref 4.0–10.5)

## 2015-04-03 LAB — GLUCOSE, CAPILLARY: Glucose-Capillary: 97 mg/dL (ref 65–99)

## 2015-04-03 MED ORDER — ENSURE ENLIVE PO LIQD
237.0000 mL | Freq: Two times a day (BID) | ORAL | Status: DC
  Administered 2015-04-03: 237 mL via ORAL

## 2015-04-03 MED ORDER — SUCRALFATE 1 GM/10ML PO SUSP
1.0000 g | Freq: Three times a day (TID) | ORAL | Status: DC
  Administered 2015-04-03 – 2015-04-04 (×5): 1 g via ORAL
  Filled 2015-04-03 (×5): qty 10

## 2015-04-03 NOTE — Care Management Note (Signed)
Case Management Note  Patient Details  Name: James Hill MRN: [id number] Date of Birth: Jun 08, 1933  Subjective/Objective:    82 y/o m admitted w/Acute UGIB. From home. Readmit-3/6-3/10-AKI.             Action/Plan:d/c plan home.   Expected Discharge Date:   (unknown)               Expected Discharge Plan:  Home/Self Care  In-House Referral:     Discharge planning Services  CM Consult  Post Acute Care Choice:    Choice offered to:     DME Arranged:    DME Agency:     HH Arranged:    HH Agency:     Status of Service:  In process, will continue to follow  Medicare Important Message Given:    Date Medicare IM Given:    Medicare IM give by:    Date Additional Medicare IM Given:    Additional Medicare Important Message give by:     If discussed at [place] of Stay Meetings, dates discussed:    Additional Comments:  Dessa Phi, RN 04/03/2015, 10:41 AM

## 2015-04-03 NOTE — Progress Notes (Signed)
Initial Nutrition Assessment  DOCUMENTATION CODES:   Not applicable  INTERVENTION:  -Ensure Enlive po BID, each supplement provides 350 kcal and 20 grams of protein -RD continue to monitor for needs   NUTRITION DIAGNOSIS:   Inadequate oral intake related to poor appetite as evidenced by per patient/family report.  GOAL:   Patient will meet greater than or equal to 90% of their needs  MONITOR:   PO intake, I & O's, Supplement acceptance, Labs, Diet advancement  REASON FOR ASSESSMENT:   Malnutrition Screening Tool    ASSESSMENT:   82 year old male with recent diagnosis and hospitalization for autoimmune hepatitis, he was seen by GI at that time. He had endoscopy 03/25/2015 significant for severe erosive reflux esophagitis. At the time of discharge he was supposed to finish fluconazole and he was also on prednisone 20 mg daily. Patient also has history of chronic kidney disease stage III.  He presented with GI bleeds, possible sepsis.  Spoke with Mr. Nadara Mustard, daughter at bedside. Patient states that he has a good appetite now, and was doing good PTA. At one point he lost a significant amount of weight; stated prednisone caused him to not be hungry "I think the dose was too high for me." Was 125# on previous admission now up to 147#.  PO intake of FL Diet was good; 100% tray completion upon my inspection.  Nutrition-Focused physical exam completed. Findings are no fat depletion, no muscle depletion, and no edema.   Denies chewing/swallowing problems. Denies recent nausea/vomiting.  Monitor for diet advancement. PO intake should be fine.  Labs: K 5.3 Medications: Prednisone  Diet Order:  Diet full liquid Room service appropriate?: Yes; Fluid consistency:: Thin  Skin:  Reviewed, no issues  Last BM:  3/17  Height:   Ht Readings from Last 1 Encounters:  04/03/15 5\' 5"  (1.651 m)    Weight:   Wt Readings from Last 1 Encounters:  04/03/15 147 lb 14.9 oz (67.1 kg)     Ideal Body Weight:  53.6 kg  BMI:  Body mass index is 24.62 kg/(m^2).  Estimated Nutritional Needs:   Kcal:  1600-1800 calories  Protein:  65-75 grams  Fluid:  >/= 1.6L  EDUCATION NEEDS:   No education needs identified at this time  James Hill. Omarian Jaquith, MS, RD LDN After Hours/Weekend Pager [phone]

## 2015-04-03 NOTE — Progress Notes (Signed)
[facility] PROGRESS NOTE  James Hill [id number] DOB: 03/21/1933 DOA: 04/02/2015 PCP: Aretta Nip, MD  Assessment/Plan: 1. Upper GI bleed. -James Hill having a history of autoimmune hepatitis treated with prednisone in the outpatient setting. He recently underwent EGD on 03/25/2015 that revealed severe ulceration of distal esophagus. -Presented with complaints of coffee-ground emesis and melanotic stools. Also had a hemoglobin of 6.7.  -Started on IV Protonix drip  -EGD performed on 04/02/2015 showing presence of esophagitis with duodenal ulcer not showing moderate of recent bleed per GI.  -He has not shown further episodes of bleeding.  -Remains in IV Protonix, starting Carafate  -Repeat hemoglobin on 04/03/2015 stable at 10.0  -Advancing to clears.   2.  History of autoimmune hepatitis. -Liver function test showing improved AST/ALT at 61 and 97 respectively. On 03/25/2015 had AST/ALT of 190 and 302 respectively. -He remains on prednisone 20 mg by mouth daily.  3.  Stage III chronic kidney disease. -Patient having a baseline creatinine near 2.0 - 2.5 -Labs show a stable creatinine at 2.26.  4.  Question sepsis. -Workup has not shown obvious source of infection, hypotension and tachycardia may have been related to hypovolemia in setting of GI bleed. -Will discontinue IV Zosyn and vancomycin today and watch him off of antimicrobial therapy  5. History of hypertension -Blood pressure stable off of antihypertensive agents, will continue to monitor.   Code Status: Full code Family Communication: I spoke with family members present at bedside Disposition Plan: Will transfer to [place]   Consultants:  GI  Procedures:  EGD performed on 04/02/2015 showing esophagitis and duodenal ulcer without stigmata of recent bleed.  Antibiotics:  IV vancomycin stopped on 04/03/2015  IV Zosyn stopped on 04/03/2015  HPI/Subjective: James Hill is a pleasant 82 year old  gentleman with a past medical history of autoimmune hepatitis treated with steroids by Dr Paulita Fujita, history of severe ulceration of distal esophagus undergoing EGD on 03/25/2015, admitted to the medicine service on 04/02/2015 when he presented with complaints of fatigue associate with shortness of breath. It also reported coffee-ground emesis and melanotic stools. Initial labs revealed anemia with hemoglobin of 6.7. He was seen and evaluated by Dr. Oletta Lamas of GI. He was started on IV Protonix and transfused with packed red blood cells. An EGD performed on 04/02/2015 that revealed esophagitis along with a duodenal ulcer. Repeat hemoglobin on 04/03/2015 improved at 10.0 as he reported feeling much better.  Objective: Filed Vitals:   04/03/15 0500 04/03/15 0600  BP: 132/63 119/61  Pulse: 76 78  Temp:    Resp: 22 18    Intake/Output Summary (Last 24 hours) at 04/03/15 0857 Last data filed at 04/03/15 0600  Gross per 24 hour  Intake 3414.59 ml  Output   1575 ml  Net 1839.59 ml   Filed Weights   04/03/15 0500  Weight: 63.7 kg (140 lb 6.9 oz)    Exam:   General:  He is awake and alert, nontoxic appearing denies further episodes of GI bleeding, asking for his diet to be advanced, feels hungry  Cardiovascular: Regular rate rhythm normal S1-S2 no murmurs rubs or gallops  Respiratory: Normal respiratory effort lungs are clear to auscultation bilaterally  Abdomen: Soft nontender nondistended  Musculoskeletal: No edema  Data Reviewed: Basic Metabolic Panel:  Recent Labs Lab 04/02/15 0905 04/02/15 0913 04/02/15 1321 04/02/15 1324 04/03/15 0319  NA 142 140 144  --  145  K 5.3* 5.1 5.4*  --  5.3*  CL 110 109 116*  --  115*  CO2 18*  --  18*  --  21*  GLUCOSE 127* 118* 104*  --  134*  BUN 85* 85* 69*  --  64*  CREATININE 2.33* 2.10* 1.92*  --  2.26*  CALCIUM 8.2*  --  7.0*  --  8.0*  MG  --   --   --  1.9  --    Liver Function Tests:  Recent Labs Lab 04/02/15 0905  04/02/15 1321  AST 56* 61*  ALT 104* 97*  ALKPHOS 50 45  BILITOT 0.9 1.0  PROT 4.7* 4.3*  ALBUMIN 2.2* 2.0*   No results for input(s): LIPASE, AMYLASE in the last 168 hours. No results for input(s): AMMONIA in the last 168 hours. CBC:  Recent Labs Lab 04/02/15 0905 04/02/15 0913 04/03/15 0319  WBC 17.9*  --  14.3*  HGB 6.7* 6.1* 10.0*  HCT 19.8* 18.0* 28.9*  MCV 101.5*  --  88.4  PLT 107*  --  70*   Cardiac Enzymes: No results for input(s): CKTOTAL, CKMB, CKMBINDEX, TROPONINI in the last 168 hours. BNP (last 3 results) No results for input(s): BNP in the last 8760 hours.  ProBNP (last 3 results) No results for input(s): PROBNP in the last 8760 hours.  CBG:  Recent Labs Lab 04/03/15 0753  GLUCAP 97    Recent Results (from the past 240 hour(s))  MRSA PCR Screening     Status: None   Collection Time: 04/02/15  2:41 PM  Result Value Ref Range Status   MRSA by PCR NEGATIVE NEGATIVE Final    Comment:        The GeneXpert MRSA Assay (FDA approved for NASAL specimens only), is one component of a comprehensive MRSA colonization surveillance program. It is not intended to diagnose MRSA infection nor to guide or monitor treatment for MRSA infections.      Studies: Dg Chest Portable 1 View  04/02/2015  CLINICAL DATA:  Shortness of breath for several days EXAM: PORTABLE CHEST 1 VIEW COMPARISON:  None. FINDINGS: Lungs are clear. Heart size and pulmonary vascularity are normal. No adenopathy. No bone lesions. IMPRESSION: No edema or consolidation. Electronically Signed   By: Lowella Grip III M.D.   On: 04/02/2015 09:17    Scheduled Meds: . [START ON 04/05/2015] pantoprazole (PROTONIX) IV  40 mg Intravenous Q12H  . predniSONE  20 mg Oral Q breakfast  . sodium chloride flush  3 mL Intravenous Q12H  . sucralfate  1 g Oral TID WC & HS   Continuous Infusions: . sodium chloride 75 mL/hr at 04/02/15 1600  . pantoprozole (PROTONIX) infusion 8 mg/hr (04/03/15 0434)     Principal Problem:   Acute upper GI bleed Active Problems:   Weight loss   CKD (chronic kidney disease), stage III   Thrombocytopenia (HCC)   Acute blood loss anemia   Autoimmune hepatitis treated with steroids (HCC)   Hyperkalemia   Leukocytosis   Lactic acidosis    Time spent: 35 min    Oletta Buehring, [facility] Pager [phone]. If 7PM-7AM, please contact night-coverage at [email or web address], password [facility] 04/03/2015, 8:57 AM  LOS: 1 day

## 2015-04-03 NOTE — Progress Notes (Signed)
[facility] PROGRESS NOTE Subjective patient taking liquids without any problems. Denies any pain or bleeding. EGD yesterday showed fairly significant esophagitis the duodenal ulcer.  Objective: Vital signs in last 24 hours: Temp:  [97.4 F (36.3 C)-98.9 F (37.2 C)] 97.5 F (36.4 C) (03/17 1046) Pulse Rate:  [74-106] 74 (03/17 1046) Resp:  [10-26] 21 (03/17 0800) BP: (97-162)/(43-80) 118/65 mmHg (03/17 1046) SpO2:  [95 %-100 %] 100 % (03/17 1046) Weight:  [63.7 kg (140 lb 6.9 oz)-67.1 kg (147 lb 14.9 oz)] 67.1 kg (147 lb 14.9 oz) (03/17 1046) Last BM Date: 04/02/15  Intake/Output from previous day: 03/16 0701 - 03/17 0700 In: 3414.6 [I.V.:2144.6; Blood:970; IV Piggyback:300] Out: 1575 [Urine:1575] Intake/Output this shift:    PE:  Abdomen-- nontender  Lab Results:  Recent Labs  04/02/15 0905 04/02/15 0913 04/03/15 0319  WBC 17.9*  --  14.3*  HGB 6.7* 6.1* 10.0*  HCT 19.8* 18.0* 28.9*  PLT 107*  --  70*   BMET  Recent Labs  04/02/15 0905 04/02/15 0913 04/02/15 1321 04/03/15 0319  NA 142 140 144 145  K 5.3* 5.1 5.4* 5.3*  CL 110 109 116* 115*  CO2 18*  --  18* 21*  CREATININE 2.33* 2.10* 1.92* 2.26*   LFT  Recent Labs  04/02/15 0905 04/02/15 1321  PROT 4.7* 4.3*  AST 56* 61*  ALT 104* 97*  ALKPHOS 50 45  BILITOT 0.9 1.0   PT/INR  Recent Labs  04/02/15 0905  LABPROT 16.6*  INR 1.33   PANCREAS No results for input(s): LIPASE in the last 72 hours.       Studies/Results: Dg Chest Portable 1 View  04/02/2015  CLINICAL DATA:  Shortness of breath for several days EXAM: PORTABLE CHEST 1 VIEW COMPARISON:  None. FINDINGS: Lungs are clear. Heart size and pulmonary vascularity are normal. No adenopathy. No bone lesions. IMPRESSION: No edema or consolidation. Electronically Signed   By: Lowella Grip III M.D.   On: 04/02/2015 09:17    Medications: I have reviewed the patient's current medications.  Assessment/Plan: 1.  Hematemesis/upper G.I. bleeding probably due to duodenal ulcer and ulcerative esophagitis. Tolerating for liquids at this point. We go ahead and continue that and advanced to soft diet tomorrow stable. Would discharge on BID PPI. Follow-up with Dr. Amedeo Plenty in the office in about one month. Please call us for further problems.   Deva Ron JR,Lorri Fukuhara L 04/03/2015, 12:38 PM  This note was created using voice recognition software. Minor errors may Have occurred unintentionally.  Pager: [phone] If no answer or after hours call [phone]

## 2015-04-04 LAB — URINE CULTURE

## 2015-04-04 MED ORDER — SUCRALFATE 1 G PO TABS: 1.0000 g | ORAL_TABLET | Freq: Three times a day (TID) | ORAL | Status: DC

## 2015-04-04 MED ORDER — PANTOPRAZOLE SODIUM 40 MG PO TBEC: 40.0000 mg | DELAYED_RELEASE_TABLET | Freq: Two times a day (BID) | ORAL | Status: DC

## 2015-04-04 NOTE — Progress Notes (Signed)
Pt tolerated regular diet breakfast.  Pt stable and eager for discharge.  Pt given d/c instructions with granddaughter at bedside.  Instructed on f/u appts, when to call MD, meds, new prescriptions, prescriptions given--pt verbalized understanding and asked appropriate questions.  Pt discharged in wheelchair, belongings packed, driven home by granddaughter.

## 2015-04-04 NOTE — Discharge Summary (Signed)
Physician Discharge Summary  James Hill [id number] DOB: [birth year] DOA: 04/02/2015  PCP: Aretta Nip, MD  Admit date: 04/02/2015 Discharge date: 04/04/2015  Time spent: 35 minutes  Recommendations for Outpatient Follow-up:  1. Please follow-up on a CBC on hospital follow-up visit, admitted for GI bleed. 2. He was discharged on twice a day Protonix and Carafate   Discharge Diagnoses:  Principal Problem:   Acute upper GI bleed Active Problems:   Weight loss   CKD (chronic kidney disease), stage III   Thrombocytopenia (HCC)   Acute blood loss anemia   Autoimmune hepatitis treated with steroids (HCC)   Hyperkalemia   Leukocytosis   Lactic acidosis   Discharge Condition: Stable  Diet recommendation: Heart healthy diet  Filed Weights   04/03/15 0500 04/03/15 1046  Weight: 63.7 kg (140 lb 6.9 oz) 67.1 kg (147 lb 14.9 oz)    History of present illness:  82 year old male with recent diagnosis and hospitalization for autoimmune hepatitis, he was seen by GI at that time. He had endoscopy 03/25/2015 significant for severe erosive reflux esophagitis. At the time of discharge he was supposed to finish fluconazole and he was also on prednisone 20 mg daily. Patient also has history of chronic kidney disease stage III. Patient reported to [facility] today with main concern for ongoing shortness of breath for last couple of days, weakness and fatigue. He did not have any gross bleed. Normal stool color per patient. He did report having nausea and bloody vomiting prior to the admission. No fevers or chills. No chest pain or palpitations. No reports of lightheadedness or loss of consciousness. No urinary complaints.  In ED, blood pressure was 97/48. At this time he is receiving second IV 1 liter bolus. His blood pressure is 133/84. Temp lowest was 96.6 F, HR 120, RR 24. Blood work was notable for hemoglobin of 6.7 and a repeat level VI.1, white blood cell count 17.9, platelets  107, potassium 5.3 and repeat level V.1. Creatinine was 2.33. Lactic acid 6.69. Chest x-ray showed no acute cardiopulmonary findings. Urinalysis is unremarkable. GI was consulted and will see the patient in consultation. Meanwhile, patient started on Protonix drip and Protonix 40 mg IV every 12 hours initiated  Hospital Course:  Mr. James Hill is a pleasant 82 year old gentleman with a past medical history of autoimmune hepatitis treated with steroids by Dr Paulita Fujita, history of severe ulceration of distal esophagus undergoing EGD on 03/25/2015, admitted to the medicine service on 04/02/2015 when he presented with complaints of fatigue associate with shortness of breath. It also reported coffee-ground emesis and melanotic stools. Initial labs revealed anemia with hemoglobin of 6.7. He was seen and evaluated by Dr. Oletta Lamas of GI. He was started on IV Protonix and transfused with packed red blood cells. An EGD performed on 04/02/2015 that revealed esophagitis along with a duodenal ulcer. Repeat hemoglobin on 04/03/2015 improved at 10.0 as he reported feeling much better.   Upper GI bleed. -Mr. James Hill having a history of autoimmune hepatitis treated with prednisone in the outpatient setting. He recently underwent EGD on 03/25/2015 that revealed severe ulceration of distal esophagus. -Presented with complaints of coffee-ground emesis and melanotic stools. Also had a hemoglobin of 6.7.  -Started on IV Protonix drip  -EGD performed on 04/02/2015 showing presence of esophagitis with duodenal ulcer not showing moderate of recent bleed per GI.  -He has not shown further episodes of bleeding.  -He tolerated advancement of diet. -Was discharged on Protonix and Carafate   2.  History of autoimmune hepatitis. -Liver function test showing improved AST/ALT at 61 and 97 respectively. On 03/25/2015 had AST/ALT of 190 and 302 respectively. -Was continued on prednisone 20 mg by mouth daily on discharge.  3. Stage III  chronic kidney disease. -Patient having a baseline creatinine near 2.0 - 2.5 -Labs show a stable creatinine at 2.26.  4. Question sepsis. -Workup has not shown obvious source of infection, hypotension and tachycardia may have been related to hypovolemia in setting of GI bleed. -He remains stable off of IV antimicrobial therapy doubt he has active infection   Procedures:  EGD performed on 04/02/2015 showing esophagitis and duodenal ulcer without stigmata of recent bleed.  Consultations:  GI  Discharge Exam: Filed Vitals:   04/03/15 2241 04/04/15 0717  BP: 106/89 107/45  Pulse: 83 76  Temp: 97.9 F (36.6 C) 98.7 F (37.1 C)  Resp: 16 16    General: He is awake and alert, states feeling well, feels ready to go home today.  Cardiovascular: Regular rate rhythm normal S1-S2 no murmurs rubs or gallops  Respiratory: Normal respiratory effort lungs are clear to auscultation bilaterally  Abdomen: Soft nontender nondistended  Musculoskeletal: No edema   Discharge Instructions   Discharge Instructions    Call MD for:  difficulty breathing, headache or visual disturbances    Complete by:  As directed      Call MD for:  extreme fatigue    Complete by:  As directed      Call MD for:  hives    Complete by:  As directed      Call MD for:  persistant dizziness or light-headedness    Complete by:  As directed      Call MD for:  persistant nausea and vomiting    Complete by:  As directed      Call MD for:  redness, tenderness, or signs of infection (pain, swelling, redness, odor or green/yellow discharge around incision site)    Complete by:  As directed      Call MD for:  severe uncontrolled pain    Complete by:  As directed      Call MD for:  temperature >100.4    Complete by:  As directed      Call MD for:    Complete by:  As directed      Diet - low sodium heart healthy    Complete by:  As directed      Increase activity slowly    Complete by:  As directed            Current Discharge Medication List    START taking these medications   Details  sucralfate (CARAFATE) 1 g tablet Take 1 tablet (1 g total) by mouth 4 (four) times daily -  with meals and at bedtime. Qty: 90 tablet, Refills: 0      CONTINUE these medications which have CHANGED   Details  pantoprazole (PROTONIX) 40 MG tablet Take 1 tablet (40 mg total) by mouth 2 (two) times daily before a meal. Qty: 60 tablet, Refills: 0      CONTINUE these medications which have NOT CHANGED   Details  feeding supplement, ENSURE ENLIVE, (ENSURE ENLIVE) LIQD Take 237 mLs by mouth 3 (three) times daily.    predniSONE (DELTASONE) 10 MG tablet Take 2 tablets (20 mg total) by mouth daily with breakfast. Qty: 30 tablet, Refills: 0       No Known Allergies Follow-up Information    Follow  up with Aretta Nip, MD In 2 weeks.   Specialty:  Family Medicine   Contact information:   [place] [id number] [phone]        The results of significant diagnostics from this hospitalization (including imaging, microbiology, ancillary and laboratory) are listed below for reference.    Significant Diagnostic Studies: Ct Soft Tissue Neck Wo Contrast  03/23/2015  CLINICAL DATA:  82 year old male with dysphagia. Poor renal function. Initial encounter. EXAM: CT NECK WITHOUT CONTRAST TECHNIQUE: Multidetector CT imaging of the neck was performed following the standard protocol without intravenous contrast. COMPARISON:  None. FINDINGS: Pharynx and larynx: No mass or inflammation noted on this unenhanced exam. Coaptation membranes left fossa of [place] felt to be an incidental finding. Upper thoracic esophagus under distended and evaluation limited. Salivary glands: No primary worrisome mass or inflammation. Thyroid:  Motion degraded without obvious mass. Lymph nodes: No adenopathy. Vascular: Limited evaluation without contrast. Left carotid bifurcation calcifications noted. Limited  intracranial: Limited imaging unremarkable. Visualized orbits: Limited imaging unremarkable. Mastoids and visualized paranasal sinuses: Limited imaging unremarkable. Skeleton: Cervical spondylotic changes without bony destructive lesion. Upper chest: Negative. IMPRESSION: No mass or inflammation noted on this unenhanced exam. Electronically Signed   By: Genia Del M.D.   On: 03/23/2015 19:22   Dg Chest Portable 1 View  04/02/2015  CLINICAL DATA:  Shortness of breath for several days EXAM: PORTABLE CHEST 1 VIEW COMPARISON:  None. FINDINGS: Lungs are clear. Heart size and pulmonary vascularity are normal. No adenopathy. No bone lesions. IMPRESSION: No edema or consolidation. Electronically Signed   By: Lowella Grip III M.D.   On: 04/02/2015 09:17    Microbiology: Recent Results (from the past 240 hour(s))  Culture, Urine     Status: None (Preliminary result)   Collection Time: 04/02/15 10:30 AM  Result Value Ref Range Status   Specimen Description URINE, CLEAN CATCH  Final   Special Requests NONE  Final   Culture   Final    50,000 COLONIES/mL ENTEROCOCCUS SPECIES SUSCEPTIBILITIES TO FOLLOW Performed at [facility]    Report Status PENDING  Incomplete  Culture, blood (x 2)     Status: None (Preliminary result)   Collection Time: 04/02/15  1:23 PM  Result Value Ref Range Status   Specimen Description BLOOD LEFT ARM  Final   Special Requests IN PEDIATRIC BOTTLE  [place]  Final   Culture   Final    NO GROWTH < 24 HOURS Performed at [facility]    Report Status PENDING  Incomplete  Culture, blood (x 2)     Status: None (Preliminary result)   Collection Time: 04/02/15  1:27 PM  Result Value Ref Range Status   Specimen Description BLOOD LEFT ARM  Final   Special Requests IN PEDIATRIC BOTTLE  3CC  Final   Culture   Final    NO GROWTH < 24 HOURS Performed at [facility]    Report Status PENDING  Incomplete  MRSA PCR Screening     Status: None   Collection  Time: 04/02/15  2:41 PM  Result Value Ref Range Status   MRSA by PCR NEGATIVE NEGATIVE Final    Comment:        The GeneXpert MRSA Assay (FDA approved for NASAL specimens only), is one component of a comprehensive MRSA colonization surveillance program. It is not intended to diagnose MRSA infection nor to guide or monitor treatment for MRSA infections.      Labs:  Basic Metabolic Panel:  Recent Labs Lab 04/02/15 0905 04/02/15 0913 04/02/15 1321 04/02/15 1324 04/03/15 0319  NA 142 140 144  --  145  K 5.3* 5.1 5.4*  --  5.3*  CL 110 109 116*  --  115*  CO2 18*  --  18*  --  21*  GLUCOSE 127* 118* 104*  --  134*  BUN 85* 85* 69*  --  64*  CREATININE 2.33* 2.10* 1.92*  --  2.26*  CALCIUM 8.2*  --  7.0*  --  8.0*  MG  --   --   --  1.9  --    Liver Function Tests:  Recent Labs Lab 04/02/15 0905 04/02/15 1321  AST 56* 61*  ALT 104* 97*  ALKPHOS 50 45  BILITOT 0.9 1.0  PROT 4.7* 4.3*  ALBUMIN 2.2* 2.0*   No results for input(s): LIPASE, AMYLASE in the last 168 hours. No results for input(s): AMMONIA in the last 168 hours. CBC:  Recent Labs Lab 04/02/15 0905 04/02/15 0913 04/03/15 0319  WBC 17.9*  --  14.3*  HGB 6.7* 6.1* 10.0*  HCT 19.8* 18.0* 28.9*  MCV 101.5*  --  88.4  PLT 107*  --  70*   Cardiac Enzymes: No results for input(s): CKTOTAL, CKMB, CKMBINDEX, TROPONINI in the last 168 hours. BNP: BNP (last 3 results) No results for input(s): BNP in the last 8760 hours.  ProBNP (last 3 results) No results for input(s): PROBNP in the last 8760 hours.  CBG:  Recent Labs Lab 04/03/15 0753  GLUCAP 97       Signed:  Kelvin Cellar MD.  [facility] 04/04/2015, 7:58 AM

## 2015-04-07 LAB — CULTURE, BLOOD (ROUTINE X 2)
CULTURE: NO GROWTH
Culture: NO GROWTH

## 2015-04-08 ENCOUNTER — Encounter (HOSPITAL_COMMUNITY): Payer: Self-pay

## 2015-04-08 ENCOUNTER — Emergency Department (HOSPITAL_COMMUNITY): Payer: Commercial Managed Care - HMO

## 2015-04-08 ENCOUNTER — Inpatient Hospital Stay (HOSPITAL_COMMUNITY)
Admission: EM | Admit: 2015-04-08 | Discharge: 2015-04-13 | DRG: 378 | Disposition: A | Discharge status: Home / Self Care | Payer: Commercial Managed Care - HMO | Attending: Internal Medicine | Admitting: Internal Medicine

## 2015-04-08 DIAGNOSIS — R0602 Shortness of breath: Secondary | ICD-10-CM | POA: Diagnosis present

## 2015-04-08 DIAGNOSIS — K279 Peptic ulcer, site unspecified, unspecified as acute or chronic, without hemorrhage or perforation: Secondary | ICD-10-CM | POA: Diagnosis present

## 2015-04-08 DIAGNOSIS — E872 Acidosis, unspecified: Secondary | ICD-10-CM | POA: Diagnosis present

## 2015-04-08 DIAGNOSIS — IMO0002 Reserved for concepts with insufficient information to code with codable children: Secondary | ICD-10-CM | POA: Diagnosis present

## 2015-04-08 DIAGNOSIS — K921 Melena: Secondary | ICD-10-CM | POA: Diagnosis not present

## 2015-04-08 DIAGNOSIS — N179 Acute kidney failure, unspecified: Secondary | ICD-10-CM | POA: Diagnosis present

## 2015-04-08 DIAGNOSIS — D696 Thrombocytopenia, unspecified: Secondary | ICD-10-CM | POA: Diagnosis not present

## 2015-04-08 DIAGNOSIS — K260 Acute duodenal ulcer with hemorrhage: Secondary | ICD-10-CM | POA: Diagnosis not present

## 2015-04-08 DIAGNOSIS — Z7952 Long term (current) use of systemic steroids: Secondary | ICD-10-CM

## 2015-04-08 DIAGNOSIS — Z9289 Personal history of other medical treatment: Secondary | ICD-10-CM

## 2015-04-08 DIAGNOSIS — K449 Diaphragmatic hernia without obstruction or gangrene: Secondary | ICD-10-CM | POA: Diagnosis present

## 2015-04-08 DIAGNOSIS — K259 Gastric ulcer, unspecified as acute or chronic, without hemorrhage or perforation: Secondary | ICD-10-CM | POA: Diagnosis present

## 2015-04-08 DIAGNOSIS — E46 Unspecified protein-calorie malnutrition: Secondary | ICD-10-CM | POA: Insufficient documentation

## 2015-04-08 DIAGNOSIS — D62 Acute posthemorrhagic anemia: Secondary | ICD-10-CM | POA: Diagnosis not present

## 2015-04-08 DIAGNOSIS — K209 Esophagitis, unspecified: Secondary | ICD-10-CM | POA: Diagnosis present

## 2015-04-08 DIAGNOSIS — E440 Moderate protein-calorie malnutrition: Secondary | ICD-10-CM | POA: Diagnosis present

## 2015-04-08 DIAGNOSIS — K269 Duodenal ulcer, unspecified as acute or chronic, without hemorrhage or perforation: Secondary | ICD-10-CM | POA: Diagnosis present

## 2015-04-08 DIAGNOSIS — K922 Gastrointestinal hemorrhage, unspecified: Secondary | ICD-10-CM | POA: Diagnosis not present

## 2015-04-08 DIAGNOSIS — N183 Chronic kidney disease, stage 3 unspecified: Secondary | ICD-10-CM | POA: Diagnosis present

## 2015-04-08 DIAGNOSIS — R069 Unspecified abnormalities of breathing: Secondary | ICD-10-CM | POA: Diagnosis not present

## 2015-04-08 DIAGNOSIS — R000 Tachycardia, unspecified: Secondary | ICD-10-CM | POA: Diagnosis present

## 2015-04-08 DIAGNOSIS — D649 Anemia, unspecified: Secondary | ICD-10-CM

## 2015-04-08 DIAGNOSIS — E875 Hyperkalemia: Secondary | ICD-10-CM | POA: Diagnosis present

## 2015-04-08 DIAGNOSIS — Z8719 Personal history of other diseases of the digestive system: Secondary | ICD-10-CM

## 2015-04-08 DIAGNOSIS — D72829 Elevated white blood cell count, unspecified: Secondary | ICD-10-CM | POA: Diagnosis not present

## 2015-04-08 DIAGNOSIS — F419 Anxiety disorder, unspecified: Secondary | ICD-10-CM | POA: Diagnosis present

## 2015-04-08 DIAGNOSIS — T380X5A Adverse effect of glucocorticoids and synthetic analogues, initial encounter: Secondary | ICD-10-CM | POA: Diagnosis present

## 2015-04-08 DIAGNOSIS — R740 Nonspecific elevation of levels of transaminase and lactic acid dehydrogenase [LDH]: Secondary | ICD-10-CM | POA: Diagnosis present

## 2015-04-08 DIAGNOSIS — K3189 Other diseases of stomach and duodenum: Secondary | ICD-10-CM | POA: Diagnosis not present

## 2015-04-08 DIAGNOSIS — Z6820 Body mass index (BMI) 20.0-20.9, adult: Secondary | ICD-10-CM | POA: Diagnosis not present

## 2015-04-08 DIAGNOSIS — S37009A Unspecified injury of unspecified kidney, initial encounter: Secondary | ICD-10-CM | POA: Diagnosis present

## 2015-04-08 DIAGNOSIS — E860 Dehydration: Secondary | ICD-10-CM | POA: Diagnosis not present

## 2015-04-08 DIAGNOSIS — K254 Chronic or unspecified gastric ulcer with hemorrhage: Secondary | ICD-10-CM

## 2015-04-08 DIAGNOSIS — K250 Acute gastric ulcer with hemorrhage: Secondary | ICD-10-CM | POA: Diagnosis not present

## 2015-04-08 DIAGNOSIS — Z79899 Other long term (current) drug therapy: Secondary | ICD-10-CM

## 2015-04-08 DIAGNOSIS — N289 Disorder of kidney and ureter, unspecified: Secondary | ICD-10-CM | POA: Insufficient documentation

## 2015-04-08 DIAGNOSIS — K754 Autoimmune hepatitis: Secondary | ICD-10-CM | POA: Diagnosis present

## 2015-04-08 HISTORY — DX: Thrombocytopenia, unspecified: D69.6

## 2015-04-08 HISTORY — DX: Autoimmune hepatitis: K75.4

## 2015-04-08 HISTORY — DX: Peptic ulcer, site unspecified, unspecified as acute or chronic, without hemorrhage or perforation: K27.9

## 2015-04-08 HISTORY — DX: Ulcer of esophagus without bleeding: K22.10

## 2015-04-08 HISTORY — DX: Gastrointestinal hemorrhage, unspecified: K92.2

## 2015-04-08 HISTORY — DX: Personal history of other medical treatment: Z92.89

## 2015-04-08 HISTORY — DX: Disorder of kidney and ureter, unspecified: N28.9

## 2015-04-08 HISTORY — DX: Chronic kidney disease, stage 3 (moderate): N18.3

## 2015-04-08 LAB — HEMOGLOBIN AND HEMATOCRIT, BLOOD
HCT: 24 % — ABNORMAL LOW (ref 39.0–52.0)
HEMOGLOBIN: 8.1 g/dL — AB (ref 13.0–17.0)

## 2015-04-08 LAB — CBC WITH DIFFERENTIAL/PLATELET
BASOS PCT: 0 %
Basophils Absolute: 0 10*3/uL (ref 0.0–0.1)
EOS ABS: 0 10*3/uL (ref 0.0–0.7)
EOS PCT: 0 %
HCT: 20.6 % — ABNORMAL LOW (ref 39.0–52.0)
Hemoglobin: 6.8 g/dL — CL (ref 13.0–17.0)
Lymphocytes Relative: 13 %
Lymphs Abs: 2 10*3/uL (ref 0.7–4.0)
MCH: 32.4 pg (ref 26.0–34.0)
MCHC: 33 g/dL (ref 30.0–36.0)
MCV: 98.1 fL (ref 78.0–100.0)
MONOS PCT: 7 %
Monocytes Absolute: 1.1 10*3/uL — ABNORMAL HIGH (ref 0.1–1.0)
NEUTROS ABS: 12.6 10*3/uL — AB (ref 1.7–7.7)
NRBC: 4 /100{WBCs} — AB
Neutrophils Relative %: 80 %
PLATELETS: 137 10*3/uL — AB (ref 150–400)
RBC: 2.1 MIL/uL — AB (ref 4.22–5.81)
RDW: 17.5 % — AB (ref 11.5–15.5)
WBC: 15.7 10*3/uL — ABNORMAL HIGH (ref 4.0–10.5)

## 2015-04-08 LAB — POC OCCULT BLOOD, ED: Fecal Occult Bld: POSITIVE — AB

## 2015-04-08 LAB — HEPATIC FUNCTION PANEL
ALT: 100 U/L — ABNORMAL HIGH (ref 17–63)
AST: 50 U/L — ABNORMAL HIGH (ref 15–41)
Albumin: 2.4 g/dL — ABNORMAL LOW (ref 3.5–5.0)
Alkaline Phosphatase: 53 U/L (ref 38–126)
Bilirubin, Direct: 0.3 mg/dL (ref 0.1–0.5)
Indirect Bilirubin: 0.7 mg/dL (ref 0.3–0.9)
Total Bilirubin: 1 mg/dL (ref 0.3–1.2)
Total Protein: 4.8 g/dL — ABNORMAL LOW (ref 6.5–8.1)

## 2015-04-08 LAB — BASIC METABOLIC PANEL
Anion gap: 15 (ref 5–15)
BUN: 69 mg/dL — AB (ref 6–20)
CHLORIDE: 108 mmol/L (ref 101–111)
CO2: 18 mmol/L — ABNORMAL LOW (ref 22–32)
CREATININE: 2.54 mg/dL — AB (ref 0.61–1.24)
Calcium: 8 mg/dL — ABNORMAL LOW (ref 8.9–10.3)
GFR calc Af Amer: 26 mL/min — ABNORMAL LOW (ref >60)
GFR calc non Af Amer: 22 mL/min — ABNORMAL LOW (ref >60)
GLUCOSE: 141 mg/dL — AB (ref 65–99)
POTASSIUM: 5.1 mmol/L (ref 3.5–5.1)
Sodium: 141 mmol/L (ref 135–145)

## 2015-04-08 LAB — PREPARE RBC (CROSSMATCH)

## 2015-04-08 MED ORDER — ONDANSETRON HCL 4 MG PO TABS: 4.0000 mg | ORAL_TABLET | Freq: Four times a day (QID) | ORAL | Status: DC | PRN

## 2015-04-08 MED ORDER — SODIUM CHLORIDE 0.9 % IV SOLN
INTRAVENOUS | Status: DC
  Administered 2015-04-09 – 2015-04-10 (×3): via INTRAVENOUS
  Administered 2015-04-10: 100 mL via INTRAVENOUS
  Administered 2015-04-11 (×2): via INTRAVENOUS

## 2015-04-08 MED ORDER — SODIUM CHLORIDE 0.9 % IV SOLN
10.0000 mL/h | Freq: Once | INTRAVENOUS | Status: AC
  Administered 2015-04-08: 10 mL/h via INTRAVENOUS

## 2015-04-08 MED ORDER — ACETAMINOPHEN 325 MG PO TABS: 650.0000 mg | ORAL_TABLET | Freq: Four times a day (QID) | ORAL | Status: DC | PRN

## 2015-04-08 MED ORDER — PANTOPRAZOLE SODIUM 40 MG IV SOLR
40.0000 mg | Freq: Once | INTRAVENOUS | Status: AC
  Administered 2015-04-08: 40 mg via INTRAVENOUS
  Filled 2015-04-08: qty 40

## 2015-04-08 MED ORDER — ONDANSETRON HCL 4 MG/2ML IJ SOLN
4.0000 mg | Freq: Four times a day (QID) | INTRAMUSCULAR | Status: DC | PRN
Start: 2015-04-08 — End: 2015-04-13

## 2015-04-08 MED ORDER — PREDNISONE 20 MG PO TABS
20.0000 mg | ORAL_TABLET | Freq: Every day | ORAL | Status: DC
  Administered 2015-04-08 – 2015-04-09 (×2): 20 mg via ORAL
  Filled 2015-04-08 (×2): qty 1

## 2015-04-08 MED ORDER — SODIUM CHLORIDE 0.9 % IV BOLUS (SEPSIS)
1000.0000 mL | Freq: Once | INTRAVENOUS | Status: AC
  Administered 2015-04-08: 1000 mL via INTRAVENOUS

## 2015-04-08 MED ORDER — SUCRALFATE 1 G PO TABS
1.0000 g | ORAL_TABLET | Freq: Three times a day (TID) | ORAL | Status: DC
  Administered 2015-04-08 – 2015-04-13 (×17): 1 g via ORAL
  Filled 2015-04-08 (×23): qty 1

## 2015-04-08 MED ORDER — ACETAMINOPHEN 650 MG RE SUPP: 650.0000 mg | Freq: Four times a day (QID) | RECTAL | Status: DC | PRN

## 2015-04-08 MED ORDER — MAGIC MOUTHWASH W/LIDOCAINE
5.0000 mL | Freq: Four times a day (QID) | ORAL | Status: DC
Start: 2015-04-08 — End: 2015-04-13
  Administered 2015-04-08 – 2015-04-12 (×11): 5 mL via ORAL
  Filled 2015-04-08 (×23): qty 5

## 2015-04-08 MED ORDER — SODIUM CHLORIDE 0.9 % IV SOLN: Freq: Once | INTRAVENOUS | Status: DC

## 2015-04-08 MED ORDER — POLYETHYLENE GLYCOL 3350 17 G PO PACK
17.0000 g | PACK | ORAL | Status: AC
  Administered 2015-04-08 – 2015-04-09 (×4): 17 g via ORAL
  Filled 2015-04-08 (×3): qty 1

## 2015-04-08 NOTE — ED Notes (Signed)
Pt ambulated to restroom. Pt seemed a little unsteady and was having some trouble breathing. When pt got to restroom, he was very winded. Sat on toilet to catch his breathe before undressing to use commode

## 2015-04-08 NOTE — ED Notes (Signed)
Pt can go up at 13:10

## 2015-04-08 NOTE — ED Notes (Signed)
Bed: [id number] Expected date:  Expected time:  Means of arrival:  Comments: resp distress/ 85%

## 2015-04-08 NOTE — Progress Notes (Signed)
Pt family member reported to RN that pt felt short of breath and not acting like himself. RN assessed pt, vital signs were stable, lungs sound clear. Charge nurse called into do a second assessment, charge nurse agreed with this nurses assessment. Rapid response nurse called for additional assessment, who also agreed with previous assessments. On-call notified, Baltazar Najjar called back and ordered stat hemoglobin/hematocrit recheck. Will call back with results and continue to monitor closely.

## 2015-04-08 NOTE — ED Notes (Signed)
He c/o shortness of breath and exertional dyspnea x 2 days.  He has hx of hepatitis (c) and "kidney trouble".  He arrives here short of breath and in no distress.

## 2015-04-08 NOTE — Progress Notes (Addendum)
Shift event: Earlier in shift, RRRN was called to see pt for SOB. This NP spoke to [facility], Juliann Pulse, at that time. Pt was not in respiratory distress, RR was normal, no increased WOB, no chest pain or wheezing. O2 bumped to 3L for comfort.  Later, RN paged this NP stating pt was still feeling SOB. Ordered EKG and H/H and NP to bedside. S: Feels SOB. States it started Wednesday and is not any worse. This episode started an hour ago when he tried to void. Denies CP or any pain. Feels a tad anxious but no worse now than earlier in day. No n/v.  O: 82 yo AAM who appears well. His affect is somewhat dull as he is not conversational nor smiling. He is oriented, alert, but keeps his eyes closed a lot. Card: RRR. 12 lead shows ST with PACs. No ST elevation or other acute changes. Resp: Normal effort. Lungs CTA with good air exchange. No neuro focal deficits noted. Skin is warm, dry.  A/P: 1. Subjective SOB without evidence of acute respiratory distress. He appears comfortable. Lungs are clear. RR normal. Likely, symptomatic anemia. O2 for comfort. No hypoxia noted.  2. ST with PACs. Likely from anemia. Thought about PE, but pt has no CP and no overt anxiety. His DDimer would likely be elevated given his acute illness. Can not use anticoagulation due to GIB.  3. GIB-reviewed chart and he has had episodic SOB before and during admission. Hgb is not quite where it should be after 2 Units of blood. Will go ahead and TF one more unit tonight (especially given his symptoms and a reported Hgb of 10 on past labs) and check H/H after. He is for endoscopy in am. GI on board. No overt bleeding-no bloody BM tonight.  Updated granddaughter at bedside. Discussed plan with RN. Should pt's sx worsen before TF finishes or are no better after transfusion, will check Ddimer-although, given his acute issues, it is likely to be elevated. Will follow.  Clance Boll, NP [facility] Update: Pt feeling better, less SOB. Blood  just starting. H/H after.  KJKG, NP [facility] Update: Hgb 9.5 after transfusion. No further c/o SOB. Less tachycardic.  KJKG, NP [facility]

## 2015-04-08 NOTE — ED Notes (Signed)
EDP at bedside  

## 2015-04-08 NOTE — H&P (Signed)
History and Physical:    James Hill   [id number] DOB: [birth year] DOA: 04/08/2015  Referring MD/provider: Clayton Bibles, PA-C PCP: Aretta Nip, MD   Chief Complaint: Fatigue and shortness of breath  History of Present Illness:   James Hill is an 83 y.o. male with hx autoimmune hepatitis treated with prednisone, with recent admission 04/02/15-04/04/15 for GI bleeding and acute blood loss anemia with Hgb 6.7, found to have severe ulcerative esophagitis and duodenal ulcer by EGD which was done on 04/02/15. He was discharged home on protonix and carafate with hgb 10 on 04/03/15 and was doing well until last night when he became much more SOB and unable to perform activities due to fatigue and dyspnea. Denies any further bleeding including melanotic, red, or bloody stools, any vomiting. Reports he is taking all of his medications. Denies fevers, CP, cough, abdominal pain. Took one dose of Aleve since discharge but denies taking this daily.  Upon initial evaluation in the ED, the patient's hemoglobin was found to be 6.8 and his stools were fecal occult positive.  ROS:   Review of Systems  Constitutional: Positive for malaise/fatigue. Negative for fever and chills.  HENT: Negative.   Eyes: Negative.   Respiratory: Positive for shortness of breath. Negative for cough.   Cardiovascular: Negative for chest pain and palpitations.  Gastrointestinal: Negative for heartburn, nausea, vomiting, abdominal pain, diarrhea, blood in stool and melena.  Genitourinary: Negative.   Musculoskeletal: Negative.   Neurological: Positive for weakness. Negative for headaches.  Endo/Heme/Allergies: Negative.        Cold intolerance  Psychiatric/Behavioral: Negative.      Past Medical History:   Past Medical History  Diagnosis Date  . Hepatitis C   . Renal disorder   . History of blood transfusion   . Renal insufficiency   . Autoimmune hepatitis treated with steroids ([place]) 04/02/2015  . CKD  (chronic kidney disease), stage III 03/23/2015  . GI bleed 04/08/2015  . Thrombocytopenia ([place]) 03/23/2015  . Erosive esophagitis   . PUD (peptic ulcer disease)     Past Surgical History:   Past Surgical History  Procedure Laterality Date  . Esophagoscopy  03/25/2015    Procedure: ESOPHAGOSCOPY;  Surgeon: Teena Irani, MD;  Location: [facility];  Service: Endoscopy;;  . Esophagogastroduodenoscopy N/A 04/02/2015    Procedure: ESOPHAGOGASTRODUODENOSCOPY (EGD);  Surgeon: Teena Irani, MD;  Location: [facility];  Service: Endoscopy;  Laterality: N/A;    Social History:   Social History   Social History  . Marital Status: Legally Separated    Spouse Name: N/A  . Number of Children: N/A  . Years of Education: N/A   Occupational History  . Not on file.   Social History Main Topics  . Smoking status: Never Smoker   . Smokeless tobacco: Never Used  . Alcohol Use: Yes  . Drug Use: Not on file  . Sexual Activity: No   Other Topics Concern  . Not on file   Social History Narrative    Family history:   History reviewed. No pertinent family history. Patient is unable to provide any details.  Allergies   Review of patient's allergies indicates no known allergies.  Current Medications:   Prior to Admission medications   Medication Sig Start Date End Date Taking? Authorizing Provider  feeding supplement, ENSURE ENLIVE, (ENSURE ENLIVE) LIQD Take 237 mLs by mouth 3 (three) times daily. 03/27/15  Yes Modena Jansky, MD  naproxen sodium (ANAPROX) 220 MG tablet Take 440 mg  by mouth 2 (two) times daily as needed (pain).   Yes Historical Provider, MD  pantoprazole (PROTONIX) 40 MG tablet Take 1 tablet (40 mg total) by mouth 2 (two) times daily before a meal. 04/04/15  Yes Kelvin Cellar, MD  predniSONE (DELTASONE) 10 MG tablet Take 2 tablets (20 mg total) by mouth daily with breakfast. 03/28/15  Yes Modena Jansky, MD  sucralfate (CARAFATE) 1 g tablet Take 1 tablet (1 g total) by mouth 4  (four) times daily -  with meals and at bedtime. 04/04/15  Yes Kelvin Cellar, MD    Physical Exam:   Filed Vitals:   04/08/15 1325 04/08/15 1505 04/08/15 1506 04/08/15 1545  BP: 120/67 127/62 127/62 125/59  Pulse: 105 108 108 109  Temp: 98.6 F (37 C) 98 F (36.7 C) 98 F (36.7 C) 99.2 F (37.3 C)  TempSrc: Oral Oral Oral Oral  Resp: 20 18 18 20   Height: 5\' 5"  (1.651 m)     Weight: 65 kg (143 lb 4.8 oz)     SpO2: 100% 100% 100% 100%     Physical Exam: Blood pressure 125/59, pulse 109, temperature 99.2 F (37.3 C), temperature source Oral, resp. rate 20, height 5\' 5"  (1.651 m), weight 65 kg (143 lb 4.8 oz), SpO2 100 %. Gen: No acute distress. Head: Normocephalic, atraumatic. Eyes: PERRL, EOMI, sclerae nonicteric. Mouth: Oropharynx aphthous ulcers x 2 on tongue Neck: Supple, no thyromegaly, no lymphadenopathy, no jugular venous distention. Chest: Lungs are clear to auscultation bilaterally. CV: Heart sounds are regular. Tachycardic. No murmurs, rubs, or gallops. Abdomen: Soft, nontender, nondistended with normal active bowel sounds. Extremities: Extremities with 1+ pitting edema Skin: Warm and dry. Neuro: Alert and oriented times 2; grossly nonfocal. Psych: Mood and affect normal.   Data Review:    Labs: Basic Metabolic Panel:  Recent Labs Lab 04/02/15 0905 04/02/15 0913 04/02/15 1321 04/02/15 1324 04/03/15 0319 04/08/15 1040  NA 142 140 144  --  145 141  K 5.3* 5.1 5.4*  --  5.3* 5.1  CL 110 109 116*  --  115* 108  CO2 18*  --  18*  --  21* 18*  GLUCOSE 127* 118* 104*  --  134* 141*  BUN 85* 85* 69*  --  64* 69*  CREATININE 2.33* 2.10* 1.92*  --  2.26* 2.54*  CALCIUM 8.2*  --  7.0*  --  8.0* 8.0*  MG  --   --   --  1.9  --   --    Liver Function Tests:  Recent Labs Lab 04/02/15 0905 04/02/15 1321 04/08/15 1040  AST 56* 61* 50*  ALT 104* 97* 100*  ALKPHOS 50 45 53  BILITOT 0.9 1.0 1.0  PROT 4.7* 4.3* 4.8*  ALBUMIN 2.2* 2.0* 2.4*   No results  for input(s): LIPASE, AMYLASE in the last 168 hours. No results for input(s): AMMONIA in the last 168 hours. CBC:  Recent Labs Lab 04/02/15 0905 04/02/15 0913 04/03/15 0319 04/08/15 1040  WBC 17.9*  --  14.3* 15.7*  NEUTROABS  --   --   --  12.6*  HGB 6.7* 6.1* 10.0* 6.8*  HCT 19.8* 18.0* 28.9* 20.6*  MCV 101.5*  --  88.4 98.1  PLT 107*  --  70* 137*   Cardiac Enzymes: No results for input(s): CKTOTAL, CKMB, CKMBINDEX, TROPONINI in the last 168 hours.  BNP (last 3 results) No results for input(s): PROBNP in the last 8760 hours. CBG:  Recent Labs Lab 04/03/15 [phone]  GLUCAP 97    Radiographic Studies: Dg Chest Portable 1 View  04/08/2015  CLINICAL DATA:  Shortness of breath for 2 days EXAM: PORTABLE CHEST 1 VIEW COMPARISON:  04/02/2015 FINDINGS: The heart size and mediastinal contours are within normal limits. Both lungs are clear. The visualized skeletal structures are unremarkable. IMPRESSION: No active disease. Electronically Signed   By: Inez Catalina M.D.   On: 04/08/2015 10:47   *I have personally reviewed the images above*  EKG: Not ordered.   Assessment/Plan:   Principal Problem:   Acute blood loss anemiaSecondary to recurrent GI bleeding with a known history of endoscopically proven PUD/history of blood transfusion - We'll give 2 units of blood today. GI reconsulted. For capsule endoscopy tomorrow. - Continue PPI therapy.  Active Problems:   AKI/CKD (chronic kidney disease), stage III - GFR currently 26, which is lower than his usual GFR of around 37. - Hydrate and monitor.    Thrombocytopenia ([place]) - Thought to be related to autoimmune hepatitis. Platelet count stable and improved from discharge values.    Autoimmune hepatitis treated with steroids (HCC)/transaminitis - Continue prednisone.    Leukocytosis - Likely related to steroid use. No current signs of infection.    Metabolic acidosis - Likely related to chronic kidney disease with acute  worsening of renal function. Hydrate and monitor.    DVT prophylaxis - SCDs ordered.  Code Status / Family Communication / Disposition Plan:   Code Status: Full. Family Communication: Amanda Cockayne, granddaughter. Disposition Plan: Home when hemoglobin stable, likely 2-3 days. Lives alone. Granddaughter assists when needed.  Attestation regarding necessity of inpatient status:   The appropriate admission status for this patient is INPATIENT. Inpatient status is judged to be reasonable and necessary in order to provide the required intensity of service to ensure the patient's safety. The patient's presenting symptoms, physical exam findings, and initial radiographic and laboratory data in the context of their chronic comorbidities is felt to place them at high risk for further clinical deterioration. Furthermore, it is not anticipated that the patient will be medically stable for discharge from the hospital within 2 midnights of admission. The following factors support the admission status of inpatient.   -The patient's presenting symptoms include dyspnea, weakness. - The worrisome physical exam findings include tachycardia, Hemoccult-positive stools. - The initial radiographic and laboratory data are worrisome because of hemoglobin of 6.8. - The chronic co-morbidities include autoimmune hepatitis and thrombocytopenia on chronic steroids. - Patient requires inpatient status due to high intensity of service, high risk for further deterioration and high frequency of surveillance required. - I certify that at the point of admission it is my clinical judgment that the patient will require inpatient hospital care spanning beyond 2 midnights from the point of admission.   Time spent: One hour  [facility] Pager [phone] Cell: [phone]   If 7PM-7AM, please contact night-coverage [email or web address] Password [email or web address] 04/08/2015, 5:22 PM

## 2015-04-08 NOTE — Progress Notes (Addendum)
ED Cm spoke with pt and male family member at bedside  Pt confirms he had not seen his pcp or a specialist since his 04/04/15 d/c Pt denies issues with getting or taking his medications at home Pt confirms with Cm he has been called [facility] since he has been home Pt agreed to have CM let [facility] and [facility] staff know that he has returned to hospital to follow for d/c needs

## 2015-04-08 NOTE — Progress Notes (Addendum)
Notified [facility] of pt admission- consult to [facility] in epic

## 2015-04-08 NOTE — ED Notes (Signed)
Critical Hgb of 6.8 received from lab.  Maricela Curet and Caryl Pina RN notified

## 2015-04-08 NOTE — ED Notes (Signed)
Pt ambulated back from restroom very winded. Placed pt on Pulse Ox and O2 was 76. Waited to see if it would go up, and it didn't. Place pt back on Non-rebreather at 10L. Pt jumped to 100 O2. If pt stays at 100 on Non-Re breather, pt can then be placed on Nasal Canula at 4L. Raquel Sarna the PA is aware. She also recommend to place Bedside commode in pt's room.

## 2015-04-08 NOTE — ED Provider Notes (Signed)
CSN: [id number]     Arrival date & time 04/08/15  [id number] History   First MD Initiated Contact with Patient 04/08/15 [phone]     Chief Complaint  Patient presents with  . Shortness of Breath     (Consider location/radiation/quality/duration/timing/severity/associated sxs/prior Treatment) The history is provided by the patient and a relative.     Pt with hx autoimmune hepatitis treated with prednisone, with recent admission for GI bleed and anemia Hgb 6.7, found to have severe ulcerative esophagitis and duodenal ulcer.  Has been started on protonix and carafate, was d/c 04/04/15 after with hgb 10 on 04/03/15 and was doing well until last night when he became much more SOB and unable to perform activities due to fatigue and dyspnea.  Denies any further bleeding including melanotic, red, or bloody stools, any vomiting.  Reports he is taking all of his medications.  Denies fevers, CP, cough, abdominal pain.      Past Medical History  Diagnosis Date  . Hepatitis C   . Renal disorder   . History of blood transfusion   . Renal insufficiency    Past Surgical History  Procedure Laterality Date  . Esophagoscopy  03/25/2015    Procedure: ESOPHAGOSCOPY;  Surgeon: Teena Irani, MD;  Location: [facility];  Service: Endoscopy;;  . Esophagogastroduodenoscopy N/A 04/02/2015    Procedure: ESOPHAGOGASTRODUODENOSCOPY (EGD);  Surgeon: Teena Irani, MD;  Location: [facility];  Service: Endoscopy;  Laterality: N/A;   No family history on file. Social History  Substance Use Topics  . Smoking status: Never Smoker   . Smokeless tobacco: Never Used  . Alcohol Use: Yes    Review of Systems  All other systems reviewed and are negative.     Allergies  Review of patient's allergies indicates no known allergies.  Home Medications   Prior to Admission medications   Medication Sig Start Date End Date Taking? Authorizing Provider  feeding supplement, ENSURE ENLIVE, (ENSURE ENLIVE) LIQD Take 237 mLs by mouth 3  (three) times daily. 03/27/15   Modena Jansky, MD  pantoprazole (PROTONIX) 40 MG tablet Take 1 tablet (40 mg total) by mouth 2 (two) times daily before a meal. 04/04/15   Kelvin Cellar, MD  predniSONE (DELTASONE) 10 MG tablet Take 2 tablets (20 mg total) by mouth daily with breakfast. 03/28/15   Modena Jansky, MD  sucralfate (CARAFATE) 1 g tablet Take 1 tablet (1 g total) by mouth 4 (four) times daily -  with meals and at bedtime. 04/04/15   Kelvin Cellar, MD   BP 116/71 mmHg  Pulse 108  Temp(Src) 97.9 F (36.6 C) (Oral)  Resp 18  SpO2 100% Physical Exam  Constitutional: He appears well-developed and well-nourished. No distress.  HENT:  Head: Normocephalic and atraumatic.  Neck: Neck supple.  Cardiovascular: Normal rate and regular rhythm.   Pulmonary/Chest: Breath sounds normal. Tachypnea noted. No respiratory distress. He has no decreased breath sounds. He has no wheezes. He has no rhonchi. He has no rales.  Lying flat   Abdominal: Soft. He exhibits no distension and no mass. There is no tenderness. There is no rebound and no guarding.  Genitourinary: Prostate is enlarged. Prostate is not tender.  Reddish brown stool on glove  Musculoskeletal: He exhibits no edema or tenderness.  Neurological: He is alert. He exhibits normal muscle tone.  Skin: He is not diaphoretic.  Nursing note and vitals reviewed.   ED Course  Procedures (including critical care time) Labs Review Labs Reviewed  BASIC METABOLIC PANEL -  Abnormal; Notable for the following:    CO2 18 (*)    Glucose, Bld 141 (*)    BUN 69 (*)    Creatinine, Ser 2.54 (*)    Calcium 8.0 (*)    GFR calc non Af Amer 22 (*)    GFR calc Af Amer 26 (*)    All other components within normal limits  CBC WITH DIFFERENTIAL/PLATELET - Abnormal; Notable for the following:    WBC 15.7 (*)    RBC 2.10 (*)    Hemoglobin 6.8 (*)    HCT 20.6 (*)    RDW 17.5 (*)    Platelets 137 (*)    nRBC 4 (*)    Neutro Abs 12.6 (*)     Monocytes Absolute 1.1 (*)    All other components within normal limits  HEPATIC FUNCTION PANEL - Abnormal; Notable for the following:    Total Protein 4.8 (*)    Albumin 2.4 (*)    AST 50 (*)    ALT 100 (*)    All other components within normal limits  POC OCCULT BLOOD, ED - Abnormal; Notable for the following:    Fecal Occult Bld POSITIVE (*)    All other components within normal limits  TYPE AND SCREEN  PREPARE RBC (CROSSMATCH)    Imaging Review Dg Chest Portable 1 View  04/08/2015  CLINICAL DATA:  Shortness of breath for 2 days EXAM: PORTABLE CHEST 1 VIEW COMPARISON:  04/02/2015 FINDINGS: The heart size and mediastinal contours are within normal limits. Both lungs are clear. The visualized skeletal structures are unremarkable. IMPRESSION: No active disease. Electronically Signed   By: Inez Catalina M.D.   On: 04/08/2015 10:47   I have personally reviewed and evaluated these images and lab results as part of my medical decision-making.   EKG Interpretation None       9:58 AM Dr Regenia Skeeter made aware of the patient.    12:03 PM Pt has been admitted to [facility] by Dr Regenia Skeeter, Dr Rama accepting MD.  I have spoken with Dr Stacie Gl[facility] GI, to request consult.    MDM   Final diagnoses:  Symptomatic anemia  Gastrointestinal hemorrhage, unspecified gastritis, unspecified gastrointestinal hemorrhage type    Pt with autoimmune hepatitis treated with prednisone, recently found to have distal esophageal ulceration and GI bleeding.  Today with DOE and SOB.  O2 80s on room air.  Hemoccult positive with brick colored stool.   Hgb 6.8, similar to level at last admission.  Transfusion of PRBC ordered in ED.  Admitted to [facility], [facility] to consult.      Clayton Bibles, PA-C 04/08/15 1603  Sherwood Gambler, MD 04/09/15 [phone]

## 2015-04-08 NOTE — Consult Note (Signed)
Referring Provider: Dr. Rockne Menghini Primary Care Physician:  Aretta Nip, MD Primary Gastroenterologist:  Dr. Paulita Fujita  Reason for Consultation:  Recurrent GI bleeding  HPI: James Hill is a 82 y.o. male being treated for autoimmune hepatitis with steroids, although approximately 3 weeks ago the patient had to be hospitalized because of severe esophagitis (not clear that it was Candida) to the point where he was almost unable to eat; as a consequence, his prednisone dose has been tapered significantly.  A week ago, he was admitted through the emergency room because of shortness of breath and weakness and a hemoglobin of 6.7, as compared to 10.0 upon discharge about a week earlier. He was transfused 2 units and underwent repeat endoscopy which showed an apparent clean-based duodenal ulcer (new finding), but resolution of his esophagitis.  He was discharged 4 days ago but returned to the emergency room today because of shortness of breath, whereupon it was noted that he is hemoglobin had drifted down over the past 5 days from 10.0 to 6.8.  On his earlier hospitalization, there had been development of acute renal insufficiency with a significant bump in his BUN, from 28 to 85, possibly partly related to upper GI bleeding of indeterminate source. On the current admission, however, his BUN of 69 is basically unchanged from discharge 4 days ago.  The patient has not had any dyspeptic symptomatology, and has not noted dark or melenic stools.  Although naproxen is listed on his home medication list, discussion with his daughter indicates that he has not been on naproxen or any nonsteroidal inflammatory drugs or aspirin..  The patient has received already 1 unit of packed cells this morning, and is receiving a second unit currently.     Past Medical History  Diagnosis Date  . Hepatitis C   . Renal disorder   . History of blood transfusion   . Renal insufficiency   . Autoimmune hepatitis treated with  steroids ([place]) 04/02/2015  . CKD (chronic kidney disease), stage III 03/23/2015  . GI bleed 04/08/2015  . Thrombocytopenia ([place]) 03/23/2015  . Erosive esophagitis   . PUD (peptic ulcer disease)     Past Surgical History  Procedure Laterality Date  . Esophagoscopy  03/25/2015    Procedure: ESOPHAGOSCOPY;  Surgeon: Teena Irani, MD;  Location: [facility];  Service: Endoscopy;;  . Esophagogastroduodenoscopy N/A 04/02/2015    Procedure: ESOPHAGOGASTRODUODENOSCOPY (EGD);  Surgeon: Teena Irani, MD;  Location: [facility];  Service: Endoscopy;  Laterality: N/A;    Prior to Admission medications   Medication Sig Start Date End Date Taking? Authorizing Provider  feeding supplement, ENSURE ENLIVE, (ENSURE ENLIVE) LIQD Take 237 mLs by mouth 3 (three) times daily. 03/27/15  Yes Modena Jansky, MD  naproxen sodium (ANAPROX) 220 MG tablet Take 440 mg by mouth 2 (two) times daily as needed (pain).   Yes Historical Provider, MD  pantoprazole (PROTONIX) 40 MG tablet Take 1 tablet (40 mg total) by mouth 2 (two) times daily before a meal. 04/04/15  Yes Kelvin Cellar, MD  predniSONE (DELTASONE) 10 MG tablet Take 2 tablets (20 mg total) by mouth daily with breakfast. 03/28/15  Yes Modena Jansky, MD  sucralfate (CARAFATE) 1 g tablet Take 1 tablet (1 g total) by mouth 4 (four) times daily -  with meals and at bedtime. 04/04/15  Yes Kelvin Cellar, MD    Current Facility-Administered Medications  Medication Dose Route Frequency Provider Last Rate Last Dose  . 0.9 %  sodium chloride infusion  Intravenous Continuous Venetia Maxon Rama, MD      . acetaminophen (TYLENOL) tablet 650 mg  650 mg Oral Q6H PRN Venetia Maxon Rama, MD       Or  . acetaminophen (TYLENOL) suppository 650 mg  650 mg Rectal Q6H PRN Venetia Maxon Rama, MD      . magic mouthwash w/lidocaine  5 mL Oral QID Venetia Maxon Rama, MD   5 mL at 04/08/15 1431  . ondansetron (ZOFRAN) tablet 4 mg  4 mg Oral Q6H PRN Christina P Rama, MD       Or  . ondansetron  (ZOFRAN) injection 4 mg  4 mg Intravenous Q6H PRN Christina P Rama, MD      . polyethylene glycol (MIRALAX / GLYCOLAX) packet 17 g  17 g Oral Q2H Chanler Mendonca, MD      . predniSONE (DELTASONE) tablet 20 mg  20 mg Oral Q breakfast Christina P Rama, MD      . sucralfate (CARAFATE) tablet 1 g  1 g Oral TID WC & HS Venetia Maxon Rama, MD        Allergies as of 04/08/2015  . (No Known Allergies)    History reviewed. No pertinent family history.  Social History   Social History  . Marital Status: Legally Separated    Spouse Name: N/A  . Number of Children: N/A  . Years of Education: N/A   Occupational History  . Not on file.   Social History Main Topics  . Smoking status: Never Smoker   . Smokeless tobacco: Never Used  . Alcohol Use: Yes  . Drug Use: Not on file  . Sexual Activity: No   Other Topics Concern  . Not on file   Social History Narrative    Review of Systems: Negative for urinary symptoms, joint pains, skin rashes, or chest pain. Positive for shortness of breath (better since transfusion today).  Physical Exam: Vital signs in last 24 hours: Temp:  [97.9 F (36.6 C)-99.2 F (37.3 C)] 99.2 F (37.3 C) (03/22 1545) Pulse Rate:  [103-115] 109 (03/22 1545) Resp:  [17-20] 20 (03/22 1545) BP: (106-127)/(59-77) 125/59 mmHg (03/22 1545) SpO2:  [81 %-100 %] 100 % (03/22 1545) Weight:  [65 kg (143 lb 4.8 oz)-66.679 kg (147 lb)] 65 kg (143 lb 4.8 oz) (03/22 1325)   General:   Alert,  Well-developed, well-nourished, pleasant and cooperative in NAD Head:  Normocephalic and atraumatic. Eyes:  Sclera clear, no icterus.   Neck:   No masses or thyromegaly. Lungs:  Clear throughout to auscultation.   No wheezes, crackles, or rhonchi. No evident respiratory distress. Heart:   Regular rate and rhythm; no murmurs, clicks, rubs,  or gallops. Abdomen:  Soft, nontender, nontympanitic, and nondistended. No masses, hepatosplenomegaly or ventral hernias noted. Normal bowel sounds,  without bruits, guarding, or rebound.   Rectal:  Melenic stool--very dark (not frankly black), pasty, melenic odor. Msk:   Symmetrical without gross deformities. No obvious upper extremity joint effusions. Pulses:  Normal pulse is noted. Extremities:   Without clubbing, cyanosis, or edema. Neurologic:  Alert and coherent;  grossly normal neurologically. Skin:  Intact without significant lesions or rashes. Cervical Nodes:  No significant cervical adenopathy. Psych:   Alert and cooperative. Normal mood and affect.  Intake/Output from previous day:   Intake/Output this shift: Total I/O In: 350 [Blood:350] Out: 400 [Urine:400]  Lab Results:  Recent Labs  04/08/15 1040  WBC 15.7*  HGB 6.8*  HCT 20.6*  PLT 137*  BMET  Recent Labs  04/08/15 1040  NA 141  K 5.1  CL 108  CO2 18*  GLUCOSE 141*  BUN 69*  CREATININE 2.54*  CALCIUM 8.0*   LFT  Recent Labs  04/08/15 1040  PROT 4.8*  ALBUMIN 2.4*  AST 50*  ALT 100*  ALKPHOS 53  BILITOT 1.0  BILIDIR 0.3  IBILI 0.7   PT/INR No results for input(s): LABPROT, INR in the last 72 hours.  Studies/Results: Dg Chest Portable 1 View  04/08/2015  CLINICAL DATA:  Shortness of breath for 2 days EXAM: PORTABLE CHEST 1 VIEW COMPARISON:  04/02/2015 FINDINGS: The heart size and mediastinal contours are within normal limits. Both lungs are clear. The visualized skeletal structures are unremarkable. IMPRESSION: No active disease. Electronically Signed   By: Inez Catalina M.D.   On: 04/08/2015 10:47    Impression: 1. Recurrent GI bleeding, most likely of upper tract origin. 2. Post hemorrhagic anemia, acute. 3. Recent, resolving esophagitis--??steroid related. 4. Duodenal ulcer on recent EGD.   Plan: Capsule endoscopy tomorrow.  Nature, purpose, risks, alternatives (eg, small bowel enteroscopy) d/w pt and dtr at bedside.  Further mgt to depend on those findings.  Pt not bleeding actively enough to warrant bleeding scan.   LOS: 0  days   Zadrian Mccauley V  04/08/2015, 4:16 PM   Pager [phone] If no answer or after 5 PM call [phone]

## 2015-04-08 NOTE — ED Notes (Signed)
ED PA at bedside

## 2015-04-09 ENCOUNTER — Encounter (HOSPITAL_COMMUNITY)
Admission: EM | Disposition: A | Discharge status: Home / Self Care | Payer: Self-pay | Source: Home / Self Care | Attending: Internal Medicine

## 2015-04-09 ENCOUNTER — Encounter (HOSPITAL_COMMUNITY): Payer: Self-pay | Admitting: Gastroenterology

## 2015-04-09 DIAGNOSIS — D696 Thrombocytopenia, unspecified: Secondary | ICD-10-CM

## 2015-04-09 DIAGNOSIS — K754 Autoimmune hepatitis: Secondary | ICD-10-CM

## 2015-04-09 DIAGNOSIS — K279 Peptic ulcer, site unspecified, unspecified as acute or chronic, without hemorrhage or perforation: Secondary | ICD-10-CM

## 2015-04-09 DIAGNOSIS — N179 Acute kidney failure, unspecified: Secondary | ICD-10-CM

## 2015-04-09 DIAGNOSIS — D72829 Elevated white blood cell count, unspecified: Secondary | ICD-10-CM

## 2015-04-09 DIAGNOSIS — R740 Nonspecific elevation of levels of transaminase and lactic acid dehydrogenase [LDH]: Secondary | ICD-10-CM

## 2015-04-09 DIAGNOSIS — D62 Acute posthemorrhagic anemia: Secondary | ICD-10-CM

## 2015-04-09 DIAGNOSIS — N183 Chronic kidney disease, stage 3 (moderate): Secondary | ICD-10-CM

## 2015-04-09 DIAGNOSIS — E872 Acidosis: Secondary | ICD-10-CM

## 2015-04-09 HISTORY — PX: GIVENS CAPSULE STUDY: SHX5432

## 2015-04-09 LAB — CBC
HCT: 27 % — ABNORMAL LOW (ref 39.0–52.0)
Hemoglobin: 9.5 g/dL — ABNORMAL LOW (ref 13.0–17.0)
MCH: 30.1 pg (ref 26.0–34.0)
MCHC: 35.2 g/dL (ref 30.0–36.0)
MCV: 85.4 fL (ref 78.0–100.0)
PLATELETS: 95 10*3/uL — AB (ref 150–400)
RBC: 3.16 MIL/uL — AB (ref 4.22–5.81)
RDW: 15.6 % — ABNORMAL HIGH (ref 11.5–15.5)
WBC: 12.9 10*3/uL — AB (ref 4.0–10.5)

## 2015-04-09 LAB — LACTATE DEHYDROGENASE: LDH: 352 U/L — AB (ref 98–192)

## 2015-04-09 LAB — BASIC METABOLIC PANEL
ANION GAP: 6 (ref 5–15)
BUN: 82 mg/dL — ABNORMAL HIGH (ref 6–20)
CALCIUM: 7.8 mg/dL — AB (ref 8.9–10.3)
CO2: 20 mmol/L — AB (ref 22–32)
CREATININE: 2.05 mg/dL — AB (ref 0.61–1.24)
Chloride: 116 mmol/L — ABNORMAL HIGH (ref 101–111)
GFR, EST AFRICAN AMERICAN: 34 mL/min — AB (ref >60)
GFR, EST NON AFRICAN AMERICAN: 29 mL/min — AB (ref >60)
Glucose, Bld: 139 mg/dL — ABNORMAL HIGH (ref 65–99)
Potassium: 5.3 mmol/L — ABNORMAL HIGH (ref 3.5–5.1)
Sodium: 142 mmol/L (ref 135–145)

## 2015-04-09 LAB — FOLATE: FOLATE: 13.2 ng/mL (ref >5.9)

## 2015-04-09 LAB — IRON AND TIBC
Iron: 185 ug/dL — ABNORMAL HIGH (ref 45–182)
Saturation Ratios: 85 % — ABNORMAL HIGH (ref 17.9–39.5)
TIBC: 217 ug/dL — AB (ref 250–450)
UIBC: 32 ug/dL

## 2015-04-09 LAB — RETICULOCYTES
RBC.: 3.17 MIL/uL — AB (ref 4.22–5.81)
RETIC CT PCT: 5.9 % — AB (ref 0.4–3.1)
Retic Count, Absolute: 187 10*3/uL — ABNORMAL HIGH (ref 19.0–186.0)

## 2015-04-09 LAB — VITAMIN B12: VITAMIN B 12: 724 pg/mL (ref 180–914)

## 2015-04-09 LAB — FERRITIN: Ferritin: 120 ng/mL (ref 24–336)

## 2015-04-09 SURGERY — IMAGING PROCEDURE, GI TRACT, INTRALUMINAL, VIA CAPSULE
Anesthesia: LOCAL

## 2015-04-09 MED ORDER — PHENYLEPH-SHARK LIV OIL-MO-PET 0.25-3-14-71.9 % RE OINT
TOPICAL_OINTMENT | Freq: Three times a day (TID) | RECTAL | Status: DC | PRN
  Administered 2015-04-09: 06:00:00 via RECTAL
  Filled 2015-04-09: qty 28.4

## 2015-04-09 MED ORDER — PANTOPRAZOLE SODIUM 40 MG PO TBEC
40.0000 mg | DELAYED_RELEASE_TABLET | Freq: Two times a day (BID) | ORAL | Status: DC
  Administered 2015-04-09: 40 mg via ORAL
  Filled 2015-04-09: qty 1

## 2015-04-09 MED ORDER — ENSURE ENLIVE PO LIQD
237.0000 mL | Freq: Three times a day (TID) | ORAL | Status: DC
  Administered 2015-04-09 (×2): 237 mL via ORAL

## 2015-04-09 SURGICAL SUPPLY — 1 items: TOWEL COTTON PACK 4EA (MISCELLANEOUS) ×4 IMPLANT

## 2015-04-09 NOTE — Progress Notes (Addendum)
[facility] Progress Note   James Hill  [id number]  DOB: May 27, 1933  DOA: 04/08/2015 PCP: Aretta Nip, MD  Brief narrative: James Hill is a 82 y.o. male with autoimmune hepatitis on treatment with prednisone presents for fatigue and shortness of breath.  He was admitted on 3/6 through his 3/10 for generalized weakness and dehydration-hemoglobin was normal at that time-he also had odynophagia-EGD performed showed severe esophagitis-his prednisone was weaned and he was placed on fluconazole and Protonix twice a day He was admitted on 3/16-3/18 for shortness of breath and dark stools and hematemesis-hemoglobin had dropped to 6.1- EGD showed significant improvement and esophagitis but an ulcer in the duodenum was also noted.   He returns for a complaint of shortness of breath-no complaints of bleeding at this time but Hemoccult is still positive and hemoglobin is down to 6.8.   Subjective: Patient has no complaints.  Assessment/Plan: Principal Problem: Anemia- duodenal ulcer -Anemia possibly secondary to acute blood loss. Also investigating other causes such as hemolysis and iron, folic acid 0000000 deficiency -Having capsule endoscopy today --Continue twice a day PPI and Carafate for duodenal ulcer that was seen previously on recent EGD  Active Problems: Complaint of shortness of breath -Starts to breath rapidly at rest while I am examining him-This is clearly anxiety-lungs are clear and pulse ox is 100% checked by myself on room air-  have discussed this with the patient and his daughter    AKI on CKD (chronic kidney disease), stage III -Steadily improving with hydration  Hyperkalemia -Baseline calcium has been high-acute increase may be due to dehydration or blood that she been transfused-follow    Thrombocytopenia  -Suspected to be secondary to autoimmune hepatitis-has been stable    Autoimmune hepatitis treated with steroids -Last discharged on 20 mg of  prednisone daily which we are continuing    Leukocytosis -Likely due to above steroid use    Antibiotics: Anti-infectives    None     Code Status:     Code Status Orders        Start     Ordered   04/08/15 1330  Full code   Continuous     04/08/15 1329    Code Status History    Date Active Date Inactive Code Status Order ID Comments User Context   04/02/2015  2:37 PM 04/04/2015  1:07 PM Full Code [id number]  Robbie Lis, MD Inpatient   03/24/2015  2:49 AM 03/27/2015  4:35 PM Full Code [id number]  Theressa Millard, MD Inpatient     Family Communication: Daughter at bedside Disposition Plan: will be in the hospital over the weekend DVT prophylaxis: SCDs Consultants: GI Procedures: Beginning capsule study    Objective: Filed Weights   04/08/15 1040 04/08/15 1325 04/09/15 0856  Weight: 66.679 kg (147 lb) 65 kg (143 lb 4.8 oz) 57.153 kg (126 lb)    Intake/Output Summary (Last 24 hours) at 04/09/15 1248 Last data filed at 04/09/15 0644  Gross per 24 hour  Intake   1635 ml  Output   1200 ml  Net    435 ml     Vitals Filed Vitals:   04/09/15 0015 04/09/15 0240 04/09/15 0603 04/09/15 0856  BP: 125/62 130/75 134/73   Pulse: 120 109 105   Temp: 98 F (36.7 C) 98.2 F (36.8 C) 98.2 F (36.8 C)   TempSrc: Oral Oral Oral   Resp: 20 18 20    Height:    5' 5.5" (1.664 m)  Weight:  57.153 kg (126 lb)  SpO2: 100% 100% 100%     Exam:  General:  Pt is alert, Appears to be distressed breathing rapidly and tachycardic which begins after I started examining him  HEENT: No icterus, No thrush, oral mucosa moist  Cardiovascular: regular rate and rhythm, tachycardia, S1/S2 No murmur  Respiratory: clear to auscultation bilaterally   Abdomen: Soft, +Bowel sounds, non tender, non distended, no guarding  MSK: No cyanosis or clubbing- no pedal edema   Data Reviewed: Basic Metabolic Panel:  Recent Labs Lab 04/02/15 1321 04/02/15 1324 04/03/15 0319 04/08/15 1040  04/09/15 0451  NA 144  --  145 141 142  K 5.4*  --  5.3* 5.1 5.3*  CL 116*  --  115* 108 116*  CO2 18*  --  21* 18* 20*  GLUCOSE 104*  --  134* 141* 139*  BUN 69*  --  64* 69* 82*  CREATININE 1.92*  --  2.26* 2.54* 2.05*  CALCIUM 7.0*  --  8.0* 8.0* 7.8*  MG  --  1.9  --   --   --    Liver Function Tests:  Recent Labs Lab 04/02/15 1321 04/08/15 1040  AST 61* 50*  ALT 97* 100*  ALKPHOS 45 53  BILITOT 1.0 1.0  PROT 4.3* 4.8*  ALBUMIN 2.0* 2.4*   No results for input(s): LIPASE, AMYLASE in the last 168 hours. No results for input(s): AMMONIA in the last 168 hours. CBC:  Recent Labs Lab 04/03/15 0319 04/08/15 1040 04/08/15 2134 04/09/15 0451  WBC 14.3* 15.7*  --  12.9*  NEUTROABS  --  12.6*  --   --   HGB 10.0* 6.8* 8.1* 9.5*  HCT 28.9* 20.6* 24.0* 27.0*  MCV 88.4 98.1  --  85.4  PLT 70* 137*  --  95*   Cardiac Enzymes: No results for input(s): CKTOTAL, CKMB, CKMBINDEX, TROPONINI in the last 168 hours. BNP (last 3 results) No results for input(s): BNP in the last 8760 hours.  ProBNP (last 3 results) No results for input(s): PROBNP in the last 8760 hours.  CBG:  Recent Labs Lab 04/03/15 0753  GLUCAP 97    Recent Results (from the past 240 hour(s))  Culture, Urine     Status: None   Collection Time: 04/02/15 10:30 AM  Result Value Ref Range Status   Specimen Description URINE, CLEAN CATCH  Final   Special Requests NONE  Final   Culture   Final    50,000 COLONIES/mL ENTEROCOCCUS SPECIES Performed at [facility]    Report Status 04/04/2015 FINAL  Final   Organism ID, Bacteria ENTEROCOCCUS SPECIES  Final      Susceptibility   Enterococcus species - MIC*    AMPICILLIN <=2 SENSITIVE Sensitive     LEVOFLOXACIN 2 SENSITIVE Sensitive     NITROFURANTOIN <=16 SENSITIVE Sensitive     VANCOMYCIN <=0.5 SENSITIVE Sensitive     * 50,000 COLONIES/mL ENTEROCOCCUS SPECIES  Culture, blood (x 2)     Status: None   Collection Time: 04/02/15  1:23 PM  Result  Value Ref Range Status   Specimen Description BLOOD LEFT ARM  Final   Special Requests IN PEDIATRIC BOTTLE  [place]  Final   Culture   Final    NO GROWTH 5 DAYS Performed at [facility]    Report Status 04/07/2015 FINAL  Final  Culture, blood (x 2)     Status: None   Collection Time: 04/02/15  1:27 PM  Result Value Ref  Range Status   Specimen Description BLOOD LEFT ARM  Final   Special Requests IN PEDIATRIC BOTTLE  3CC  Final   Culture   Final    NO GROWTH 5 DAYS Performed at [facility]    Report Status 04/07/2015 FINAL  Final  MRSA PCR Screening     Status: None   Collection Time: 04/02/15  2:41 PM  Result Value Ref Range Status   MRSA by PCR NEGATIVE NEGATIVE Final    Comment:        The GeneXpert MRSA Assay (FDA approved for NASAL specimens only), is one component of a comprehensive MRSA colonization surveillance program. It is not intended to diagnose MRSA infection nor to guide or monitor treatment for MRSA infections.      Studies: Dg Chest Portable 1 View  04/08/2015  CLINICAL DATA:  Shortness of breath for 2 days EXAM: PORTABLE CHEST 1 VIEW COMPARISON:  04/02/2015 FINDINGS: The heart size and mediastinal contours are within normal limits. Both lungs are clear. The visualized skeletal structures are unremarkable. IMPRESSION: No active disease. Electronically Signed   By: Inez Catalina M.D.   On: 04/08/2015 10:47    Scheduled Meds:  Scheduled Meds: . sodium chloride   Intravenous Once  . magic mouthwash w/lidocaine  5 mL Oral QID  . pantoprazole  40 mg Oral BID AC  . predniSONE  20 mg Oral Q breakfast  . sucralfate  1 g Oral TID WC & HS   Continuous Infusions: . sodium chloride 100 mL/hr at 04/09/15 0240    Time spent on care of this patient: 40 min   Door, MD 04/09/2015, 12:48 PM  LOS: 1 day   [facility] Office  [phone] Pager - Text Page per [email or web address] If 7PM-7AM, please contact night-coverage  [email or web address]

## 2015-04-09 NOTE — Progress Notes (Signed)
Initial Nutrition Assessment  INTERVENTION:   - Continue to supplement with Ensure Enlive TID. Each supplement provides 350 kcals and 20 grams of protein.  - Continue to monitor nutritional status   NUTRITION DIAGNOSIS:   Inadequate oral intake related to poor appetite as evidenced by estimated needs.  GOAL:   Patient will meet greater than or equal to 90% of their needs  MONITOR:   PO intake, Supplement acceptance, Labs, Weight trends, Skin, I & O's  REASON FOR ASSESSMENT:   Malnutrition Screening Tool    ASSESSMENT:   82 y.o. male with hx autoimmune hepatitis treated with prednisone, with recent admission 04/02/15-04/04/15 for GI bleeding and acute blood loss anemia with Hgb 6.7, found to have severe ulcerative esophagitis and duodenal ulcer by EGD which was done on 04/02/15. He was discharged home on protonix and carafate with hgb 10 on 04/03/15 and was doing well until last night when he became much more SOB and unable to perform activities due to fatigue and dyspnea. Denies any further bleeding including melanotic, red, or bloody stools, any vomiting. Reports he is taking all of his medications. Denies fevers, CP, cough, abdominal pain. Took one dose of Aleve since discharge but denies taking this daily. Upon initial evaluation in the ED, the patient's hemoglobin was found to be 6.8 and his stools were fecal occult positive.  Pt seen for MST. Pt's BMI is 20.64 which is categorized as a healthy weight. Per chart review, pt is currently 126 lbs. Pt recently lost 20 lbs d/t esophagitis and swallowing difficulties from swelling and pain. Per chart review weight on 3/7 was 125 lbs, 3/17 was 147 lb, 3/23 was 126 lbs. RD will continue to monitor weight trends. Pt has been ordered Ensure Enlive TID while in hospital. Pt reports supplement use at home.    Pt reports good appetite PTA. Pt states swallowing issues d/t esophagitis are resolved. Pt reports no N/V, abdominal pain or avoidance  of specific foods. Pt reports eating BID which includes oatmeal for breakfast and lunch of choice. Pt states that he eats oatmeal for breakfast to take his medications. Pt states oatmeal makes swallowing meds easier.   NFPE: Mild fat depletion, no muscle depletion, and no edema. Pt has recently had weight loss but currently weight has stabilized and pt reports eating well.   Labs: Ca: 7.8 mg/dL, glucose 139 mg/dL.  Meds: Prednisone 20 mg with breakfast.  Diet Order:  Diet regular Room service appropriate?: Yes; Fluid consistency:: Thin  Skin:  Reviewed, no issues  Last BM:  3/23  Height:   Ht Readings from Last 1 Encounters:  04/09/15 5' 5.5" (1.664 m)    Weight:   Wt Readings from Last 1 Encounters:  04/09/15 126 lb (57.153 kg)    Ideal Body Weight:  61.8 kg  BMI:  Body mass index is 20.64 kg/(m^2).  Estimated Nutritional Needs:   Kcal:  1600-1800 (28-31 kcals/kg)  Protein:  65-75 g (1.2-1.3 g/kg)  Fluid:  1.6-1.8 L  EDUCATION NEEDS:   No education needs identified at this time  Geoffery Lyons, [place] Dietetic Intern Pager [phone]

## 2015-04-09 NOTE — Progress Notes (Addendum)
Pt given 2 drops lavender eo and 2 drops rosemary eo on gauze in paper med cup for inhalation aromatherapy at 1040 for anxiety.  Lind Guest, RN  Pt resting comfortably.  Lind Guest, RN

## 2015-04-09 NOTE — Progress Notes (Signed)
Appropriate post-Tx rise in hgb s/p 2 u prc's yest.     Had 1 BM this am (?appearance), per dtr.  Transient SOB w/ signif d.o.e., noted yest evening per dtr.  Pt felt like he was about to "pass out" walking to BR.   NP note reviewed.  At present, pt resting comfortably in bed, NAD, no evident resp effort, on O2. Chest clr--no rales or wheezes heard, good air movement.  CAPSULE STUDY of small bowel to be performed today--hopefully it will be able to be read this weekend.  James Hill, M.D. Pager [phone] If no answer or after 5 PM call [phone]

## 2015-04-10 ENCOUNTER — Encounter (HOSPITAL_COMMUNITY): Payer: Self-pay | Admitting: *Deleted

## 2015-04-10 ENCOUNTER — Encounter (HOSPITAL_COMMUNITY)
Admission: EM | Disposition: A | Discharge status: Home / Self Care | Payer: Self-pay | Source: Home / Self Care | Attending: Internal Medicine

## 2015-04-10 DIAGNOSIS — K922 Gastrointestinal hemorrhage, unspecified: Principal | ICD-10-CM

## 2015-04-10 DIAGNOSIS — E440 Moderate protein-calorie malnutrition: Secondary | ICD-10-CM

## 2015-04-10 DIAGNOSIS — E46 Unspecified protein-calorie malnutrition: Secondary | ICD-10-CM | POA: Insufficient documentation

## 2015-04-10 HISTORY — PX: ESOPHAGOGASTRODUODENOSCOPY: SHX5428

## 2015-04-10 LAB — CBC
HCT: 19.9 % — ABNORMAL LOW (ref 39.0–52.0)
HCT: 25.6 % — ABNORMAL LOW (ref 39.0–52.0)
HEMOGLOBIN: 8.7 g/dL — AB (ref 13.0–17.0)
Hemoglobin: 6.9 g/dL — CL (ref 13.0–17.0)
MCH: 30.7 pg (ref 26.0–34.0)
MCH: 30 pg (ref 26.0–34.0)
MCHC: 34.7 g/dL (ref 30.0–36.0)
MCHC: 34 g/dL (ref 30.0–36.0)
MCV: 88.4 fL (ref 78.0–100.0)
MCV: 88.3 fL (ref 78.0–100.0)
PLATELETS: 107 10*3/uL — AB (ref 150–400)
Platelets: 125 10*3/uL — ABNORMAL LOW (ref 150–400)
RBC: 2.25 MIL/uL — AB (ref 4.22–5.81)
RBC: 2.9 MIL/uL — AB (ref 4.22–5.81)
RDW: 17.5 % — ABNORMAL HIGH (ref 11.5–15.5)
RDW: 16.7 % — ABNORMAL HIGH (ref 11.5–15.5)
WBC: 18.5 10*3/uL — AB (ref 4.0–10.5)
WBC: 22.3 10*3/uL — ABNORMAL HIGH (ref 4.0–10.5)

## 2015-04-10 LAB — BASIC METABOLIC PANEL
Anion gap: 8 (ref 5–15)
BUN: 81 mg/dL — ABNORMAL HIGH (ref 6–20)
CALCIUM: 7.9 mg/dL — AB (ref 8.9–10.3)
CHLORIDE: 117 mmol/L — AB (ref 101–111)
CO2: 18 mmol/L — ABNORMAL LOW (ref 22–32)
CREATININE: 1.86 mg/dL — AB (ref 0.61–1.24)
GFR calc non Af Amer: 33 mL/min — ABNORMAL LOW (ref >60)
GFR, EST AFRICAN AMERICAN: 38 mL/min — AB (ref >60)
Glucose, Bld: 140 mg/dL — ABNORMAL HIGH (ref 65–99)
Potassium: 5.5 mmol/L — ABNORMAL HIGH (ref 3.5–5.1)
SODIUM: 143 mmol/L (ref 135–145)

## 2015-04-10 LAB — HEMOGLOBIN AND HEMATOCRIT, BLOOD
HEMATOCRIT: 34.8 % — AB (ref 39.0–52.0)
HEMOGLOBIN: 12.1 g/dL — AB (ref 13.0–17.0)

## 2015-04-10 LAB — HAPTOGLOBIN

## 2015-04-10 LAB — PREPARE RBC (CROSSMATCH)

## 2015-04-10 SURGERY — EGD (ESOPHAGOGASTRODUODENOSCOPY)
Anesthesia: Moderate Sedation

## 2015-04-10 MED ORDER — BUTAMBEN-TETRACAINE-BENZOCAINE 2-2-14 % EX AERO
INHALATION_SPRAY | CUTANEOUS | Status: DC | PRN
  Administered 2015-04-10: 2 via TOPICAL

## 2015-04-10 MED ORDER — MIDAZOLAM HCL 10 MG/2ML IJ SOLN
INTRAMUSCULAR | Status: DC | PRN
  Administered 2015-04-10: 1 mg via INTRAVENOUS
  Administered 2015-04-10: 2 mg via INTRAVENOUS

## 2015-04-10 MED ORDER — FENTANYL CITRATE (PF) 100 MCG/2ML IJ SOLN
INTRAMUSCULAR | Status: DC | PRN
  Administered 2015-04-10: 25 ug via INTRAVENOUS

## 2015-04-10 MED ORDER — FENTANYL CITRATE (PF) 100 MCG/2ML IJ SOLN
INTRAMUSCULAR | Status: AC
  Filled 2015-04-10: qty 4

## 2015-04-10 MED ORDER — DIPHENHYDRAMINE HCL 50 MG/ML IJ SOLN
INTRAMUSCULAR | Status: AC
  Filled 2015-04-10: qty 1

## 2015-04-10 MED ORDER — MIDAZOLAM HCL 5 MG/ML IJ SOLN
INTRAMUSCULAR | Status: AC
  Filled 2015-04-10: qty 2

## 2015-04-10 MED ORDER — PANTOPRAZOLE SODIUM 40 MG IV SOLR
40.0000 mg | Freq: Two times a day (BID) | INTRAVENOUS | Status: DC
  Administered 2015-04-10 – 2015-04-12 (×6): 40 mg via INTRAVENOUS
  Filled 2015-04-10 (×7): qty 40

## 2015-04-10 MED ORDER — SODIUM CHLORIDE 0.9 % IV SOLN: Freq: Once | INTRAVENOUS | Status: DC

## 2015-04-10 MED ORDER — SODIUM CHLORIDE 0.9 % IV SOLN
INTRAVENOUS | Status: DC
  Administered 2015-04-10: 500 mL via INTRAVENOUS

## 2015-04-10 MED ORDER — BOOST / RESOURCE BREEZE PO LIQD
1.0000 | Freq: Three times a day (TID) | ORAL | Status: DC
  Administered 2015-04-10 – 2015-04-11 (×4): 1 via ORAL

## 2015-04-10 MED ORDER — HYDROCORTISONE NA SUCCINATE PF 100 MG IJ SOLR
50.0000 mg | Freq: Three times a day (TID) | INTRAMUSCULAR | Status: DC
  Administered 2015-04-10 – 2015-04-11 (×3): 50 mg via INTRAVENOUS
  Filled 2015-04-10 (×4): qty 2

## 2015-04-10 NOTE — Progress Notes (Addendum)
[facility] Progress Note   James Hill  [id number]  DOB: 05-06-1933  DOA: 04/08/2015 PCP: Aretta Nip, MD  Brief narrative: James Hill is a 82 y.o. male with autoimmune hepatitis on treatment with prednisone presents for fatigue and shortness of breath.  He was admitted on 3/6 through his 3/10 for generalized weakness and dehydration-hemoglobin was normal at that time-he also had odynophagia-EGD performed showed severe esophagitis-his prednisone was weaned and he was placed on fluconazole and Protonix twice a day He was admitted on 3/16-3/18 for shortness of breath and dark stools and hematemesis-hemoglobin had dropped to 6.1- EGD showed significant improvement and esophagitis but an ulcer in the duodenum was also noted.   He returns for a complaint of shortness of breath-no complaints of bleeding at this time but Hemoccult is still positive and hemoglobin is down to 6.8. Later found to be having black stools in the hospital which he was probably having at home as well.    Subjective: Patient has no complaints. Thinks he has been here for 1 wk.   Assessment/Plan: Principal Problem: Anemia- duodenal ulcer -Anemia- secondary to acute blood loss. Noted to be having black stool in the hospital overnight with further dropping Hb - Also investigating other causes such as hemolysis and iron, folic acid 0000000 deficiency- no deficiency noted but LDH slightly elevated (bilirubin normal on admission) -Having capsule endoscopy - swallowed capsule on 3/23 --Continue twice a day PPI and Carafate for duodenal ulcer that was seen previously on recent EGD ADDENDUM- capsule study shows blood in stomach- going for EGD   Active Problems: Complaint of shortness of breath -Starts to breath rapidly at rest while I was examining him-  clearly anxiety-lungs are clear and pulse ox is 100% checked by myself on room air-  have discussed this with the patient and his daughter    AKI on CKD (chronic  kidney disease), stage III -Steadily improving with hydration  Hyperkalemia -Baseline K is also high 4-low 5 range -acute increase may be due to dehydration or blood that has been transfused-follow    Thrombocytopenia  -Suspected to be secondary to autoimmune hepatitis-has been stable    Autoimmune hepatitis treated with steroids -Last discharged on 20 mg of prednisone daily which we are continuing- will change to stress dose steroids for now due to acute blood loss, tachycardia    Leukocytosis -Likely due to above steroid use    Antibiotics: Anti-infectives    None     Code Status:     Code Status Orders        Start     Ordered   04/08/15 1330  Full code   Continuous     04/08/15 1329    Code Status History    Date Active Date Inactive Code Status Order ID Comments User Context   04/02/2015  2:37 PM 04/04/2015  1:07 PM Full Code [id number]  Robbie Lis, MD Inpatient   03/24/2015  2:49 AM 03/27/2015  4:35 PM Full Code [id number]  Theressa Millard, MD Inpatient     Family Communication: Daughter at bedside Disposition Plan: will be in the hospital over the weekend- transfer to SDU for acute bleeding, tachycardia DVT prophylaxis: SCDs Consultants: GI Procedures:  capsule study    Objective: Filed Weights   04/08/15 1040 04/08/15 1325 04/09/15 0856  Weight: 66.679 kg (147 lb) 65 kg (143 lb 4.8 oz) 57.153 kg (126 lb)    Intake/Output Summary (Last 24 hours) at 04/10/15 [id number] Last data filed  at 04/10/15 0649  Gross per 24 hour  Intake 2648.34 ml  Output    225 ml  Net 2423.34 ml     Vitals Filed Vitals:   04/10/15 0602 04/10/15 0629 04/10/15 0704 04/10/15 0910  BP: 129/67 123/74 129/72 129/72  Pulse: 111 98 99 107  Temp: 97.9 F (36.6 C) 97.5 F (36.4 C) 98.2 F (36.8 C) 98.4 F (36.9 C)  TempSrc: Oral Oral Oral Oral  Resp: 22 20 16 18   Height:      Weight:      SpO2: 100% 98% 99% 100%    Exam:  General:  Pt is alert, no distress-   HEENT: No  icterus, No thrush, oral mucosa moist  Cardiovascular: regular rate and rhythm, tachycardia, S1/S2 No murmur  Respiratory: clear to auscultation bilaterally   Abdomen: Soft, +Bowel sounds, non tender, non distended, no guarding  MSK: No cyanosis or clubbing- no pedal edema   Data Reviewed: Basic Metabolic Panel:  Recent Labs Lab 04/08/15 1040 04/09/15 0451 04/10/15 0444  NA 141 142 143  K 5.1 5.3* 5.5*  CL 108 116* 117*  CO2 18* 20* 18*  GLUCOSE 141* 139* 140*  BUN 69* 82* 81*  CREATININE 2.54* 2.05* 1.86*  CALCIUM 8.0* 7.8* 7.9*   Liver Function Tests:  Recent Labs Lab 04/08/15 1040  AST 50*  ALT 100*  ALKPHOS 53  BILITOT 1.0  PROT 4.8*  ALBUMIN 2.4*   No results for input(s): LIPASE, AMYLASE in the last 168 hours. No results for input(s): AMMONIA in the last 168 hours. CBC:  Recent Labs Lab 04/08/15 1040 04/08/15 2134 04/09/15 0451 04/10/15 0444 04/10/15 0800  WBC 15.7*  --  12.9* 18.5* 22.3*  NEUTROABS 12.6*  --   --   --   --   HGB 6.8* 8.1* 9.5* 6.9* 8.7*  HCT 20.6* 24.0* 27.0* 19.9* 25.6*  MCV 98.1  --  85.4 88.4 88.3  PLT 137*  --  95* 107* 125*   Cardiac Enzymes: No results for input(s): CKTOTAL, CKMB, CKMBINDEX, TROPONINI in the last 168 hours. BNP (last 3 results) No results for input(s): BNP in the last 8760 hours.  ProBNP (last 3 results) No results for input(s): PROBNP in the last 8760 hours.  CBG: No results for input(s): GLUCAP in the last 168 hours.  Recent Results (from the past 240 hour(s))  Culture, Urine     Status: None   Collection Time: 04/02/15 10:30 AM  Result Value Ref Range Status   Specimen Description URINE, CLEAN CATCH  Final   Special Requests NONE  Final   Culture   Final    50,000 COLONIES/mL ENTEROCOCCUS SPECIES Performed at [facility]    Report Status 04/04/2015 FINAL  Final   Organism ID, Bacteria ENTEROCOCCUS SPECIES  Final      Susceptibility   Enterococcus species - MIC*    AMPICILLIN  <=2 SENSITIVE Sensitive     LEVOFLOXACIN 2 SENSITIVE Sensitive     NITROFURANTOIN <=16 SENSITIVE Sensitive     VANCOMYCIN <=0.5 SENSITIVE Sensitive     * 50,000 COLONIES/mL ENTEROCOCCUS SPECIES  Culture, blood (x 2)     Status: None   Collection Time: 04/02/15  1:23 PM  Result Value Ref Range Status   Specimen Description BLOOD LEFT ARM  Final   Special Requests IN PEDIATRIC BOTTLE  [place]  Final   Culture   Final    NO GROWTH 5 DAYS Performed at [facility]    Report  Status 04/07/2015 FINAL  Final  Culture, blood (x 2)     Status: None   Collection Time: 04/02/15  1:27 PM  Result Value Ref Range Status   Specimen Description BLOOD LEFT ARM  Final   Special Requests IN PEDIATRIC BOTTLE  3CC  Final   Culture   Final    NO GROWTH 5 DAYS Performed at [facility]    Report Status 04/07/2015 FINAL  Final  MRSA PCR Screening     Status: None   Collection Time: 04/02/15  2:41 PM  Result Value Ref Range Status   MRSA by PCR NEGATIVE NEGATIVE Final    Comment:        The GeneXpert MRSA Assay (FDA approved for NASAL specimens only), is one component of a comprehensive MRSA colonization surveillance program. It is not intended to diagnose MRSA infection nor to guide or monitor treatment for MRSA infections.      Studies: Dg Chest Portable 1 View  04/08/2015  CLINICAL DATA:  Shortness of breath for 2 days EXAM: PORTABLE CHEST 1 VIEW COMPARISON:  04/02/2015 FINDINGS: The heart size and mediastinal contours are within normal limits. Both lungs are clear. The visualized skeletal structures are unremarkable. IMPRESSION: No active disease. Electronically Signed   By: Inez Catalina M.D.   On: 04/08/2015 10:47    Scheduled Meds:  Scheduled Meds: . sodium chloride   Intravenous Once  . sodium chloride   Intravenous Once  . magic mouthwash w/lidocaine  5 mL Oral QID  . pantoprazole  40 mg Oral BID AC  . predniSONE  20 mg Oral Q breakfast  . sucralfate  1 g Oral TID WC &  HS   Continuous Infusions: . sodium chloride Stopped (04/10/15 0649)    Time spent on care of this patient: 51 min   [place], MD 04/10/2015, 9:12 AM  LOS: 2 days   [facility] Office  [phone] Pager - Text Page per [email or web address] If 7PM-7AM, please contact night-coverage [email or web address]

## 2015-04-10 NOTE — Progress Notes (Signed)
[facility]  Patient Status: New  [facility] is providing the following services: any discharge home health needs identified.  If patient discharges after hours, please call [phone].   James Hill 04/10/2015, 3:08 PM

## 2015-04-10 NOTE — Progress Notes (Signed)
Yesterday's capsule study shows fresh blood in the stomach.  We will arrange for a repeat egd at about 5:30 today.  Pt's granddtr aware.  Cleotis Nipper, M.D. Pager [phone] If no answer or after 5 PM call [phone]

## 2015-04-10 NOTE — Progress Notes (Signed)
PT Cancellation Note  Patient Details Name: James Hill MRN: [id number] DOB: [birth year]   Cancelled Treatment:    Reason Eval/Treat Not Completed: PT screened, no needs identified, will sign off. Attempted PT eval. Spoke with pt and family member. Family declines PT services at this time. Instructed family to notify MD if needs change and PT involvement is needed.    [place], MPT Pager: [phone]

## 2015-04-10 NOTE — Interval H&P Note (Signed)
History and Physical Interval Note:  04/10/2015 5:46 PM  James Hill  has presented today for surgery, with the diagnosis of GI bleeding  The various methods of treatment have been discussed with the patient and family. After consideration of risks, benefits and other options for treatment, the patient has consented to  Procedure(s): ESOPHAGOGASTRODUODENOSCOPY (EGD) (N/A) as a surgical intervention .  The patient's history has been reviewed, patient examined, no change in status, stable for surgery.  I have reviewed the patient's chart and labs.  Questions were answered to the patient's satisfaction.     [employer].

## 2015-04-10 NOTE — Progress Notes (Signed)
Pt has shown evidence of rebleeding in past 48 hrs, characterized by significant drop in hgb from 9.5 to 6.9 since yesterday morning, and antecedent rise in BUN from 69 to 82 as of yesterday morning (persisting today).   This corresponds roughly to time of capsule study; with luck, perhaps it will give [place] a clue as to the origin of the bleeding.    Have confirmed that the capsule is being taken to [facility] for downloading this morning so we will TRY to get it read by early afternoon.  Depending on what it shows, pt may be a candidate for small bowel push enteroscopy.  Meanwhile, pt is feeling ok.  No nausea or abdominal pain.  Has had a couple of BM's over the past 24 hrs.   Shortness of breath has come and gone but is currently not a significant issue.  On exam, pt is lying comfortably in bed in NAD.  BP nl, HR around 110.  Chest clr anterolaterally.  Heart nl exc rate.  Abd nl BS's, NT.  Cleotis Nipper, M.D. Pager [phone] If no answer or after 5 PM call [phone]

## 2015-04-10 NOTE — Consult Note (Signed)
   [facility] CM Inpatient Consult   04/10/2015  Alfredo Diekmann Nov 28, 1933 [id number]   Follow up visit for [place] [facility] epic referral. Martin Majestic to bedside to speak with Mr. Nadara Mustard and grandaughter to explain to [place] [facility] program. Patient and granddaughter pleasantly decline. States that once patient gets current condition under control, he does well at home. Reports that recent admissions have been related to anemia and bleeding. Accepted [facility] Management brochure and contact information left at bedside to request to call if they change their mind. Will make inpatient RNCM aware patient declined [place] [facility] services.   Marthenia Rolling, MSN-Ed, RN,BSN [facility] Liaison [phone]

## 2015-04-10 NOTE — Plan of Care (Signed)
Problem: Education: Goal: Knowledge of [facility] [facility] Education information/materials will improve Outcome: Completed/Met Date Met:  04/10/15 Education provided regarding diagnosis and treatment options

## 2015-04-10 NOTE — Op Note (Signed)
[facility] Patient Name: James Hill Procedure Date: 04/10/2015 MRN: [id number] Attending MD: Lear Ng , MD Date of Birth: 01-21-1933 CSN:  Age: 82 Admit Type: Inpatient Procedure:                Upper GI endoscopy Indications:              Iron deficiency anemia, Melena Providers:                Lear Ng, MD, Cleda Daub, RN, Corliss Parish, Technician Referring MD:              Medicines:                Fentanyl 25 micrograms IV, Midazolam 3 mg IV,                            Cetacaine spray Complications:            No immediate complications. Estimated Blood Loss:     Estimated blood loss was minimal. Procedure:                Pre-Anesthesia Assessment:                           - Prior to the procedure, a History and Physical                            was performed, and patient medications and                            allergies were reviewed. The patient's tolerance of                            previous anesthesia was also reviewed. The risks                            and benefits of the procedure and the sedation                            options and risks were discussed with the patient.                            All questions were answered, and informed consent                            was obtained. Prior Anticoagulants: The patient has                            taken no previous anticoagulant or antiplatelet                            agents. ASA Grade Assessment: III - A patient with  severe systemic disease. After reviewing the risks                            and benefits, the patient was deemed in                            satisfactory condition to undergo the procedure.                           After obtaining informed consent, the endoscope was                            passed under direct vision. Throughout the                            procedure, the patient's  blood pressure, pulse, and                            oxygen saturations were monitored continuously. The                            [id number]) scope was introduced through the                            mouth, and advanced to the second part of duodenum.                            The upper GI endoscopy was accomplished without                            difficulty. The patient tolerated the procedure                            well. Scope In: Scope Out: Findings:      The oropharynx was normal.      The examined esophagus was normal.      The Z-line was regular and was found 36 cm from the incisors.      Few non-bleeding cratered gastric ulcers with no stigmata of bleeding       were found in the gastric antrum. Ulcers are punctate and could be       herpetic ulcers (biopsies of surrounding mucosa taken). The largest       lesion was 18 mm in largest dimension.      Segmental moderate mucosal changes characterized by congestion and       erythema were found in the gastric antrum. Biopsies were taken with a       cold forceps for histology. Estimated blood loss was minimal.      A small hiatal hernia was present.      One non-bleeding cratered duodenal ulcer with pigmented material was       found in the duodenal bulb. The lesion was 6 mm in largest dimension.      Segmental moderate mucosal changes characterized by congestion and       erythema were found in the duodenal bulb. Biopsies were taken with a  cold forceps for histology. Estimated blood loss was minimal.      The second portion of the duodenum was normal. Impression:               - Normal oropharynx.                           - Normal esophagus.                           - Z-line regular, 36 cm from the incisors.                           - Non-bleeding gastric ulcers with no stigmata of                            bleeding.                           - Congested and erythematous mucosa in the antrum.                             Biopsied.                           - Small hiatal hernia.                           - One non-bleeding duodenal ulcer with pigmented                            material.                           - Mucosal changes in the duodenum. Biopsied.                           - Normal second portion of the duodenum. Moderate Sedation:      Moderate (conscious) sedation was administered by the endoscopy nurse       and supervised by the endoscopist. The following parameters were       monitored: oxygen saturation, heart rate, blood pressure, and response       to care. Recommendation:           - Await pathology results.                           - Clear liquid diet.                           - Continue present medications. Procedure Code(s):        --- Professional ---                           [phone], Esophagogastroduodenoscopy, flexible,                            transoral; with biopsy, single or multiple Diagnosis Code(s):        --- Professional ---  D50.9, Iron deficiency anemia, unspecified                           K25.9, Gastric ulcer, unspecified as acute or                            chronic, without hemorrhage or perforation                           K92.1, Melena (includes Hematochezia)                           K26.9, Duodenal ulcer, unspecified as acute or                            chronic, without hemorrhage or perforation                           K44.9, Diaphragmatic hernia without obstruction or                            gangrene                           K31.89, Other diseases of stomach and duodenum CPT copyright 2016 American [employer]. All rights reserved. The codes documented in this report are preliminary and upon coder review may  be revised to meet current compliance requirements. Wilford Corner, MD Lear Ng, MD 04/10/2015 6:35:38 PM This report has been signed electronically. Number of Addenda: 0

## 2015-04-10 NOTE — Progress Notes (Signed)
CRITICAL VALUE ALERT  Critical value received:  Hemoglobin 6.9  Date of notification:  04/10/2014  Time of notification:  0520  Critical value read back:Yes.    Nurse who received alert:  J.Johnmatthew Solorio, RN  MD notified (1st page):  K.Kirby, NP  Time of first page:  0524  MD notified (2nd page):  Time of second page:  Responding MD:  [id number]  Time MD responded:  Order placed to transfuse 2 units PRBC. Will pass on to next shift and carry out new orders.

## 2015-04-10 NOTE — Brief Op Note (Addendum)
Punctate gastric ulcers without any bleeding stigmata. Small duodenal ulcer with flat pigmented material. Mucosal biopsies taken. Deep punctate ulcers could be herpetic in origin. Needs to avoid all NSAIDs. Remain on IV PPI Q 12 hours. Clears today and tomorrow and consider full liquids tomorrow afternoon. Steroids complicating healing process too but cannot abruptly stop them and will need to slowly taper when autoimmune hepatitis will permit.  Discussed results with granddaughter and patient.

## 2015-04-10 NOTE — H&P (View-Only) (Signed)
Yesterday's capsule study shows fresh blood in the stomach.  We will arrange for a repeat egd at about 5:30 today.  Pt's granddtr aware.  Cleotis Nipper, M.D. Pager [phone] If no answer or after 5 PM call [phone]

## 2015-04-11 LAB — BASIC METABOLIC PANEL
ANION GAP: 7 (ref 5–15)
BUN: 58 mg/dL — ABNORMAL HIGH (ref 6–20)
CALCIUM: 8 mg/dL — AB (ref 8.9–10.3)
CO2: 21 mmol/L — ABNORMAL LOW (ref 22–32)
CREATININE: 2.02 mg/dL — AB (ref 0.61–1.24)
Chloride: 114 mmol/L — ABNORMAL HIGH (ref 101–111)
GFR calc non Af Amer: 30 mL/min — ABNORMAL LOW (ref >60)
GFR, EST AFRICAN AMERICAN: 34 mL/min — AB (ref >60)
Glucose, Bld: 108 mg/dL — ABNORMAL HIGH (ref 65–99)
Potassium: 4.5 mmol/L (ref 3.5–5.1)
SODIUM: 142 mmol/L (ref 135–145)

## 2015-04-11 LAB — CBC
HCT: 31.4 % — ABNORMAL LOW (ref 39.0–52.0)
HEMOGLOBIN: 11 g/dL — AB (ref 13.0–17.0)
MCH: 30.7 pg (ref 26.0–34.0)
MCHC: 35 g/dL (ref 30.0–36.0)
MCV: 87.7 fL (ref 78.0–100.0)
Platelets: 85 10*3/uL — ABNORMAL LOW (ref 150–400)
RBC: 3.58 MIL/uL — ABNORMAL LOW (ref 4.22–5.81)
RDW: 17.1 % — AB (ref 11.5–15.5)
WBC: 16.4 10*3/uL — AB (ref 4.0–10.5)

## 2015-04-11 MED ORDER — PREDNISONE 20 MG PO TABS: 20.0000 mg | ORAL_TABLET | Freq: Every day | ORAL | Status: DC

## 2015-04-11 MED ORDER — PREDNISONE 20 MG PO TABS
20.0000 mg | ORAL_TABLET | Freq: Every day | ORAL | Status: DC
  Administered 2015-04-11 – 2015-04-13 (×3): 20 mg via ORAL
  Filled 2015-04-11 (×3): qty 1

## 2015-04-11 NOTE — Progress Notes (Signed)
[facility] Progress Note   Aamir Mcgrue  [id number]  DOB: October 27, 1933  DOA: 04/08/2015 PCP: Aretta Nip, MD  Brief narrative: James Hill is a 82 y.o. male with autoimmune hepatitis on treatment with prednisone presents for fatigue and shortness of breath.  He was admitted on 3/6 through his 3/10 for generalized weakness and dehydration-hemoglobin was normal at that time-he also had odynophagia-EGD performed showed severe esophagitis-his prednisone was weaned and he was placed on fluconazole and Protonix twice a day He was admitted on 3/16-3/18 for shortness of breath and dark stools and hematemesis-hemoglobin had dropped to 6.1- EGD showed significant improvement and esophagitis but an ulcer in the duodenum was also noted.   He returns for a complaint of shortness of breath-no complaints of bleeding at this time but Hemoccult is still positive and hemoglobin is down to 6.8. Later found to be having black stools in the hospital which he was probably having at home as well.    Subjective: Patient has no complaints. Thinks he has been here for 1 wk.   Assessment/Plan: Principal Problem: Anemia- duodenal ulcer -Anemia- secondary to acute blood loss. Noted to be having black stool in the hospital overnight with further dropping Hb - Also investigating other causes such as hemolysis and iron, folic acid 0000000 deficiency- no deficiency noted but LDH slightly elevated (bilirubin normal on admission) -Having capsule endoscopy - swallowed capsule on 3/23 --Continue twice a day PPI and Carafate for duodenal ulcer that was seen previously on recent EGD - capsule study on 3/24 shows blood in stomach- going for EGD done emergently - see report below- has gastric ulcers with no stigmata of bleeding, Congested and erythematous mucosa in the antrum, One non-bleeding duodenal ulcer with pigmented material. - advance to full liquids per GI recommendations - Biopsy done- HCV culture  sent   Active Problems: Complaint of shortness of breath -Starts to breath rapidly at rest while I was examining him-  clearly anxiety-lungs are clear and pulse ox is 100% checked by myself on room air-  have discussed this with the patient and his daughter- this has resolved    AKI on CKD (chronic kidney disease), stage III -Cr slightly up today- Cr better- cont to follow  Hyperkalemia -Baseline K is also high 4-low 5 range -acute increase may be due to dehydration or blood that has been transfused-much better today    Thrombocytopenia   - normal in 1/17- due to acute blood loss??    Leukocytosis - stress response?    Autoimmune hepatitis treated with steroids -Last discharged on 20 mg of prednisone daily which we are continuing- will change to stress dose steroids for now due to acute blood loss, tachycardia      Antibiotics: Anti-infectives    None     Code Status:     Code Status Orders        Start     Ordered   04/08/15 1330  Full code   Continuous     04/08/15 1329    Code Status History    Date Active Date Inactive Code Status Order ID Comments User Context   04/02/2015  2:37 PM 04/04/2015  1:07 PM Full Code [id number]  Robbie Lis, MD Inpatient   03/24/2015  2:49 AM 03/27/2015  4:35 PM Full Code [id number]  Theressa Millard, MD Inpatient     Family Communication: Daughter at bedside Disposition Plan: will be in the hospital over the weekend- transfer to SDU for acute bleeding,  tachycardia DVT prophylaxis: SCDs Consultants: GI Procedures:  capsule study    Objective: Filed Weights   04/08/15 1040 04/08/15 1325 04/09/15 0856  Weight: 66.679 kg (147 lb) 65 kg (143 lb 4.8 oz) 57.153 kg (126 lb)    Intake/Output Summary (Last 24 hours) at 04/11/15 1332 Last data filed at 04/11/15 0900  Gross per 24 hour  Intake 2696.67 ml  Output    500 ml  Net 2196.67 ml     Vitals Filed Vitals:   04/10/15 1850 04/10/15 2050  04/11/15 0022 04/11/15 0537  BP: 121/70 128/63 123/62 128/78  Pulse: 90 89 84 82  Temp:  97.9 F (36.6 C) 97.9 F (36.6 C) 97.8 F (36.6 C)  TempSrc:  Oral Oral Oral  Resp: 18 20 16 20   Height:      Weight:      SpO2: 100% 100% 100% 100%    Exam:  General:  Pt is alert, no distress-   HEENT: No icterus, No thrush, oral mucosa moist  Cardiovascular: regular rate and rhythm, tachycardia, S1/S2 No murmur  Respiratory: clear to auscultation bilaterally   Abdomen: Soft, +Bowel sounds, non tender, non distended, no guarding  MSK: No cyanosis or clubbing- no pedal edema   Data Reviewed: Basic Metabolic Panel:  Recent Labs Lab 04/08/15 1040 04/09/15 0451 04/10/15 0444 04/11/15 0810  NA 141 142 143 142  K 5.1 5.3* 5.5* 4.5  CL 108 116* 117* 114*  CO2 18* 20* 18* 21*  GLUCOSE 141* 139* 140* 108*  BUN 69* 82* 81* 58*  CREATININE 2.54* 2.05* 1.86* 2.02*  CALCIUM 8.0* 7.8* 7.9* 8.0*   Liver Function Tests:  Recent Labs Lab 04/08/15 1040  AST 50*  ALT 100*  ALKPHOS 53  BILITOT 1.0  PROT 4.8*  ALBUMIN 2.4*   No results for input(s): LIPASE, AMYLASE in the last 168 hours. No results for input(s): AMMONIA in the last 168 hours. CBC:  Recent Labs Lab 04/08/15 1040  04/09/15 0451 04/10/15 0444 04/10/15 0800 04/10/15 2014 04/11/15 0810  WBC 15.7*  --  12.9* 18.5* 22.3*  --  16.4*  NEUTROABS 12.6*  --   --   --   --   --   --   HGB 6.8*  < > 9.5* 6.9* 8.7* 12.1* 11.0*  HCT 20.6*  < > 27.0* 19.9* 25.6* 34.8* 31.4*  MCV 98.1  --  85.4 88.4 88.3  --  87.7  PLT 137*  --  95* 107* 125*  --  85*  < > = values in this interval not displayed. Cardiac Enzymes: No results for input(s): CKTOTAL, CKMB, CKMBINDEX, TROPONINI in the last 168 hours. BNP (last 3 results) No results for input(s): BNP in the last 8760 hours.  ProBNP (last 3 results) No results for input(s): PROBNP in the last 8760 hours.  CBG: No results for input(s): GLUCAP in the last 168  hours.  Recent Results (from the past 240 hour(s))  Culture, Urine     Status: None   Collection Time: 04/02/15 10:30 AM  Result Value Ref Range Status   Specimen Description URINE, CLEAN CATCH  Final   Special Requests NONE  Final   Culture   Final    50,000 COLONIES/mL ENTEROCOCCUS SPECIES Performed at [facility]    Report Status 04/04/2015 FINAL  Final   Organism ID, Bacteria ENTEROCOCCUS SPECIES  Final      Susceptibility   Enterococcus species - MIC*    AMPICILLIN <=2 SENSITIVE Sensitive  LEVOFLOXACIN 2 SENSITIVE Sensitive     NITROFURANTOIN <=16 SENSITIVE Sensitive     VANCOMYCIN <=0.5 SENSITIVE Sensitive     * 50,000 COLONIES/mL ENTEROCOCCUS SPECIES  Culture, blood (x 2)     Status: None   Collection Time: 04/02/15  1:23 PM  Result Value Ref Range Status   Specimen Description BLOOD LEFT ARM  Final   Special Requests IN PEDIATRIC BOTTLE  [place]  Final   Culture   Final    NO GROWTH 5 DAYS Performed at [facility]    Report Status 04/07/2015 FINAL  Final  Culture, blood (x 2)     Status: None   Collection Time: 04/02/15  1:27 PM  Result Value Ref Range Status   Specimen Description BLOOD LEFT ARM  Final   Special Requests IN PEDIATRIC BOTTLE  3CC  Final   Culture   Final    NO GROWTH 5 DAYS Performed at [facility]    Report Status 04/07/2015 FINAL  Final  MRSA PCR Screening     Status: None   Collection Time: 04/02/15  2:41 PM  Result Value Ref Range Status   MRSA by PCR NEGATIVE NEGATIVE Final    Comment:        The GeneXpert MRSA Assay (FDA approved for NASAL specimens only), is one component of a comprehensive MRSA colonization surveillance program. It is not intended to diagnose MRSA infection nor to guide or monitor treatment for MRSA infections.      Studies: No results found.  Scheduled Meds:  Scheduled Meds: . sodium chloride   Intravenous Once  . sodium chloride   Intravenous Once  . sodium chloride    Intravenous Once  . feeding supplement  1 Container Oral TID BM  . magic mouthwash w/lidocaine  5 mL Oral QID  . pantoprazole (PROTONIX) IV  40 mg Intravenous Q12H  . predniSONE  20 mg Oral Q breakfast  . sucralfate  1 g Oral TID WC & HS   Continuous Infusions: . sodium chloride 50 mL/hr at 04/11/15 1258    Time spent on care of this patient: 40 min   [employer], MD 04/11/2015, 1:32 PM  LOS: 3 days   [facility] Office  [phone] Pager - Text Page per [email or web address] If 7PM-7AM, please contact night-coverage [email or web address]

## 2015-04-12 LAB — CBC
HEMATOCRIT: 30.5 % — AB (ref 39.0–52.0)
HEMOGLOBIN: 10.5 g/dL — AB (ref 13.0–17.0)
MCH: 31.1 pg (ref 26.0–34.0)
MCHC: 34.4 g/dL (ref 30.0–36.0)
MCV: 90.2 fL (ref 78.0–100.0)
Platelets: 75 10*3/uL — ABNORMAL LOW (ref 150–400)
RBC: 3.38 MIL/uL — AB (ref 4.22–5.81)
RDW: 17.6 % — ABNORMAL HIGH (ref 11.5–15.5)
WBC: 15.1 10*3/uL — AB (ref 4.0–10.5)

## 2015-04-12 LAB — BASIC METABOLIC PANEL
Anion gap: 7 (ref 5–15)
BUN: 44 mg/dL — ABNORMAL HIGH (ref 6–20)
CHLORIDE: 115 mmol/L — AB (ref 101–111)
CO2: 19 mmol/L — AB (ref 22–32)
Calcium: 7.9 mg/dL — ABNORMAL LOW (ref 8.9–10.3)
Creatinine, Ser: 1.92 mg/dL — ABNORMAL HIGH (ref 0.61–1.24)
GFR calc non Af Amer: 32 mL/min — ABNORMAL LOW (ref >60)
GFR, EST AFRICAN AMERICAN: 37 mL/min — AB (ref >60)
Glucose, Bld: 101 mg/dL — ABNORMAL HIGH (ref 65–99)
POTASSIUM: 4.2 mmol/L (ref 3.5–5.1)
SODIUM: 141 mmol/L (ref 135–145)

## 2015-04-12 NOTE — Progress Notes (Signed)
[facility] Progress Note   Che Cisse  [id number]  DOB: 1933-02-09  DOA: 04/08/2015 PCP: Aretta Nip, MD  Brief narrative: Newman Navratil is a 82 y.o. male with autoimmune hepatitis on treatment with prednisone presents for fatigue and shortness of breath.  He was admitted on 3/6 through his 3/10 for generalized weakness and dehydration-hemoglobin was normal at that time-he also had odynophagia-EGD performed showed severe esophagitis-his prednisone was weaned and he was placed on fluconazole and Protonix twice a day He was admitted on 3/16-3/18 for shortness of breath and dark stools and hematemesis-hemoglobin had dropped to 6.1- EGD showed significant improvement and esophagitis but an ulcer in the duodenum was also noted.   He returns for a complaint of shortness of breath-no complaints of bleeding at this time but Hemoccult is still positive and hemoglobin is down to 6.8. Later found to be having black stools in the hospital which he was probably having at home as well.    Subjective: Patient has no complaints. Wanting to go home. Tolerating full liquids. Had BM this AM. Urinating well.   Assessment/Plan: Principal Problem:   PUD -  capsule endoscopy - swallowed capsule on 3/23 --Continue twice a day PPI and Carafate for duodenal ulcer that was seen previously on recent EGD - capsule study on 3/24 shows blood in stomach- going for EGD done emergently - see report below- has gastric ulcers with no stigmata of bleeding, Congested and erythematous mucosa in the antrum, One non-bleeding duodenal ulcer with pigmented material. - advance to soft diet today per GI recommendations- cont IV Protonix - Biopsy done- HCV culture sent  Anemia due to acute blood loss -Anemia- secondary to acute blood loss. Noted to be having black stool in the hospital overnight with further dropping Hb - Also investigating other causes such as hemolysis and iron, folic acid 0000000 deficiency- no  deficiency noted but LDH slightly elevated (bilirubin normal on admission) - received a total of 5 U PRBC- Hb up to 10-11 range   Active Problems: Complaint of shortness of breath -Starts to breath rapidly at rest while I was examining him-  clearly anxiety-lungs are clear and pulse ox is 100% checked by myself on room air-  have discussed this with the patient and his daughter- this has resolved    AKI on CKD (chronic kidney disease), stage III with chronic mild acidosis - Cr better- drinking well- stop IVF today  Hyperkalemia -Baseline K is also high 4-low 5 range -acute increase may be due to dehydration or blood that was transfused resolved    Thrombocytopenia   - normal in 1/17- drop due to consumption from acute blood loss?? Not on anticoagulation - outpt f/u    Leukocytosis - stress response?    Autoimmune hepatitis treated with steroids -Last discharged on 20 mg of prednisone daily which we are continuing- will change to stress dose steroids for now due to acute blood loss, tachycardia - Dr Paulita Fujita to discuss further treatment with steroids in setting of ulcers      Antibiotics: Anti-infectives    None     Code Status:     Code Status Orders        Start     Ordered   04/08/15 1330  Full code   Continuous     04/08/15 1329    Code Status History    Date Active Date Inactive Code Status Order ID Comments User Context   04/02/2015  2:37 PM 04/04/2015  1:07 PM  Full Code [id number]  Robbie Lis, MD Inpatient   03/24/2015  2:49 AM 03/27/2015  4:35 PM Full Code [id number]  Theressa Millard, MD Inpatient     Family Communication: Daughter at bedside Disposition Plan: will be in the hospital over the weekend- transfer to SDU for acute bleeding, tachycardia DVT prophylaxis: SCDs Consultants: GI Procedures:  capsule study    Objective: Filed Weights   04/08/15 1040 04/08/15 1325 04/09/15 0856  Weight: 66.679 kg (147 lb) 65 kg  (143 lb 4.8 oz) 57.153 kg (126 lb)    Intake/Output Summary (Last 24 hours) at 04/12/15 0937 Last data filed at 04/12/15 0600  Gross per 24 hour  Intake 1567.5 ml  Output      0 ml  Net 1567.5 ml     Vitals Filed Vitals:   04/11/15 1450 04/11/15 2100 04/12/15 0221 04/12/15 0520  BP: 120/75 105/56 133/81 119/75  Pulse: 91 78 84 85  Temp: 98.2 F (36.8 C) 98.3 F (36.8 C) 97.5 F (36.4 C) 98.2 F (36.8 C)  TempSrc: Oral Oral Oral Oral  Resp: 20 16 16 16   Height:      Weight:      SpO2: 100% 100% 99% 99%    Exam:  General:  Pt is alert, no distress-   HEENT: No icterus, No thrush, oral mucosa moist  Cardiovascular: regular rate and rhythm, tachycardia, S1/S2 No murmur  Respiratory: clear to auscultation bilaterally   Abdomen: Soft, +Bowel sounds, non tender, non distended, no guarding  MSK: No cyanosis or clubbing- no pedal edema   Data Reviewed: Basic Metabolic Panel:  Recent Labs Lab 04/08/15 1040 04/09/15 0451 04/10/15 0444 04/11/15 0810 04/12/15 0424  NA 141 142 143 142 141  K 5.1 5.3* 5.5* 4.5 4.2  CL 108 116* 117* 114* 115*  CO2 18* 20* 18* 21* 19*  GLUCOSE 141* 139* 140* 108* 101*  BUN 69* 82* 81* 58* 44*  CREATININE 2.54* 2.05* 1.86* 2.02* 1.92*  CALCIUM 8.0* 7.8* 7.9* 8.0* 7.9*   Liver Function Tests:  Recent Labs Lab 04/08/15 1040  AST 50*  ALT 100*  ALKPHOS 53  BILITOT 1.0  PROT 4.8*  ALBUMIN 2.4*   No results for input(s): LIPASE, AMYLASE in the last 168 hours. No results for input(s): AMMONIA in the last 168 hours. CBC:  Recent Labs Lab 04/08/15 1040  04/09/15 0451 04/10/15 0444 04/10/15 0800 04/10/15 2014 04/11/15 0810 04/12/15 0424  WBC 15.7*  --  12.9* 18.5* 22.3*  --  16.4* 15.1*  NEUTROABS 12.6*  --   --   --   --   --   --   --   HGB 6.8*  < > 9.5* 6.9* 8.7* 12.1* 11.0* 10.5*  HCT 20.6*  < > 27.0* 19.9* 25.6* 34.8* 31.4* 30.5*  MCV 98.1  --  85.4 88.4 88.3  --  87.7 90.2  PLT 137*  --  95* 107* 125*  --  85*  75*  < > = values in this interval not displayed. Cardiac Enzymes: No results for input(s): CKTOTAL, CKMB, CKMBINDEX, TROPONINI in the last 168 hours. BNP (last 3 results) No results for input(s): BNP in the last 8760 hours.  ProBNP (last 3 results) No results for input(s): PROBNP in the last 8760 hours.  CBG: No results for input(s): GLUCAP in the last 168 hours.  Recent Results (from the past 240 hour(s))  Culture, Urine     Status: None   Collection Time: 04/02/15 10:30 AM  Result Value Ref Range Status   Specimen Description URINE, CLEAN CATCH  Final   Special Requests NONE  Final   Culture   Final    50,000 COLONIES/mL ENTEROCOCCUS SPECIES Performed at [facility]    Report Status 04/04/2015 FINAL  Final   Organism ID, Bacteria ENTEROCOCCUS SPECIES  Final      Susceptibility   Enterococcus species - MIC*    AMPICILLIN <=2 SENSITIVE Sensitive     LEVOFLOXACIN 2 SENSITIVE Sensitive     NITROFURANTOIN <=16 SENSITIVE Sensitive     VANCOMYCIN <=0.5 SENSITIVE Sensitive     * 50,000 COLONIES/mL ENTEROCOCCUS SPECIES  Culture, blood (x 2)     Status: None   Collection Time: 04/02/15  1:23 PM  Result Value Ref Range Status   Specimen Description BLOOD LEFT ARM  Final   Special Requests IN PEDIATRIC BOTTLE  [place]  Final   Culture   Final    NO GROWTH 5 DAYS Performed at [facility]    Report Status 04/07/2015 FINAL  Final  Culture, blood (x 2)     Status: None   Collection Time: 04/02/15  1:27 PM  Result Value Ref Range Status   Specimen Description BLOOD LEFT ARM  Final   Special Requests IN PEDIATRIC BOTTLE  3CC  Final   Culture   Final    NO GROWTH 5 DAYS Performed at [facility]    Report Status 04/07/2015 FINAL  Final  MRSA PCR Screening     Status: None   Collection Time: 04/02/15  2:41 PM  Result Value Ref Range Status   MRSA by PCR NEGATIVE NEGATIVE Final    Comment:        The GeneXpert MRSA Assay (FDA approved for NASAL  specimens only), is one component of a comprehensive MRSA colonization surveillance program. It is not intended to diagnose MRSA infection nor to guide or monitor treatment for MRSA infections.      Studies: No results found.  Scheduled Meds:  Scheduled Meds: . sodium chloride   Intravenous Once  . sodium chloride   Intravenous Once  . sodium chloride   Intravenous Once  . feeding supplement  1 Container Oral TID BM  . magic mouthwash w/lidocaine  5 mL Oral QID  . pantoprazole (PROTONIX) IV  40 mg Intravenous Q12H  . predniSONE  20 mg Oral Q breakfast  . sucralfate  1 g Oral TID WC & HS   Continuous Infusions:    Time spent on care of this patient: 40 min   [place], MD 04/12/2015, 9:37 AM  LOS: 4 days   [facility] Office  [phone] Pager - Text Page per [email or web address] If 7PM-7AM, please contact night-coverage [email or web address]

## 2015-04-13 LAB — BASIC METABOLIC PANEL
ANION GAP: 8 (ref 5–15)
BUN: 37 mg/dL — ABNORMAL HIGH (ref 6–20)
CALCIUM: 8.1 mg/dL — AB (ref 8.9–10.3)
CHLORIDE: 111 mmol/L (ref 101–111)
CO2: 20 mmol/L — ABNORMAL LOW (ref 22–32)
Creatinine, Ser: 1.71 mg/dL — ABNORMAL HIGH (ref 0.61–1.24)
GFR calc non Af Amer: 36 mL/min — ABNORMAL LOW (ref >60)
GFR, EST AFRICAN AMERICAN: 42 mL/min — AB (ref >60)
Glucose, Bld: 95 mg/dL (ref 65–99)
POTASSIUM: 3.9 mmol/L (ref 3.5–5.1)
SODIUM: 139 mmol/L (ref 135–145)

## 2015-04-13 LAB — TYPE AND SCREEN
ABO/RH(D): B POS
Antibody Screen: NEGATIVE
UNIT DIVISION: 0
UNIT DIVISION: 0
UNIT DIVISION: 0
Unit division: 0
Unit division: 0

## 2015-04-13 LAB — CBC
HCT: 32.4 % — ABNORMAL LOW (ref 39.0–52.0)
HEMOGLOBIN: 11.3 g/dL — AB (ref 13.0–17.0)
MCH: 31.6 pg (ref 26.0–34.0)
MCHC: 34.9 g/dL (ref 30.0–36.0)
MCV: 90.5 fL (ref 78.0–100.0)
PLATELETS: 92 10*3/uL — AB (ref 150–400)
RBC: 3.58 MIL/uL — AB (ref 4.22–5.81)
RDW: 17.9 % — ABNORMAL HIGH (ref 11.5–15.5)
WBC: 12.8 10*3/uL — AB (ref 4.0–10.5)

## 2015-04-13 LAB — HERPES SIMPLEX VIRUS CULTURE: Culture: NOT DETECTED

## 2015-04-13 MED ORDER — PANTOPRAZOLE SODIUM 40 MG PO TBEC: 40.0000 mg | DELAYED_RELEASE_TABLET | Freq: Two times a day (BID) | ORAL | Status: DC

## 2015-04-13 NOTE — Discharge Summary (Signed)
Physician Discharge Summary  James Hill [id number] DOB: 21-May-1933 DOA: 04/08/2015  PCP: Aretta Nip, MD  Admit date: 04/08/2015 Discharge date: 04/13/2015  Time spent: 50 minutes  Recommendations for Outpatient Follow-up:  1. F/u biopsy results 2. CBC in 1 wk- f/u WBC count and platelets  Discharge Condition: stable    Discharge Diagnoses:  Principal Problem:   Acute blood loss anemia Active Problems:   CKD (chronic kidney disease), stage III   Thrombocytopenia (HCC)   Acute upper GI bleed   Autoimmune hepatitis treated with steroids (HCC)   Leukocytosis   History of blood transfusion   GI bleed   PUD (peptic ulcer disease)   Transaminitis   Metabolic acidosis   Acute kidney injury ([place])   Malnutrition of moderate degree   History of present illness:  James Hill is a 82 y.o. male with autoimmune hepatitis on treatment with prednisone presents for fatigue and shortness of breath.  He was admitted on 3/6 through his 3/10 for generalized weakness and dehydration-hemoglobin was normal at that time-he also had odynophagia-EGD performed showed severe esophagitis-his prednisone was weaned and he was placed on fluconazole and Protonix twice a day He was admitted on 3/16-3/18 for shortness of breath and dark stools and hematemesis-hemoglobin had dropped to 6.1- EGD showed significant improvement and esophagitis but an ulcer in the duodenum was also noted.   He returns for a complaint of shortness of breath-no complaints of bleeding at this time but Hemoccult is still positive and hemoglobin is down to 6.8. Later found to be having black stools in the hospital which he was probably having at home as well.    Hospital Course:  Principal Problem:  PUD --Continued twice a day PPI and Carafate for duodenal ulcer that was seen previously on recent EGD - capsule endoscopy started on 3/23 - capsule study on 3/24 shows blood in stomach- going for EGD done emergently - see  report below- has gastric ulcers with no stigmata of bleeding, Congested and erythematous mucosa in the antrum, One non-bleeding duodenal ulcer with pigmented material. - Biopsy done- HCV culture sent needs to be followed - Dr Paulita Fujita to decide on risk vs benefit of Prednisone that he is receiving for autoimmune hepatitis- the patient has an appt this week  Anemia due to acute blood loss -Anemia- secondary to acute blood loss. Patient was not sure if he had been bleeding at home. Noted to be having black stool in the hospital after admission with further dropping Hb - as he initially stated he was not not bleeding, I investigated other causes such as hemolysis and iron, folic acid 0000000 deficiency- no deficiency noted but LDH slightly elevated (bilirubin normal on admission) - received a total of 5 U PRBC- Hb up to 10-11 range   Active Problems: Complaint of shortness of breath -Starts to breath rapidly at rest while I was examining him- clearly anxiety-lungs are clear and pulse ox is 100% checked by myself on room air- have discussed this with the patient and his daughter- this has resolved   AKI on CKD (chronic kidney disease), stage III with chronic mild acidosis - resolved with hydration  Hyperkalemia -Baseline K is also high 4-low 5 range -acute increase may have been due to dehydration or blood transfusions - resolved    Thrombocytopenia  - normal in 1/17- drop due to consumption from acute blood loss?? Not on anticoagulation - outpt f/u   Leukocytosis - stress response? follow   Autoimmune hepatitis treated with  steroids -Last discharged on 20 mg of prednisone daily which we are continuing- will change to stress dose steroids for now due to acute blood loss, tachycardia - Dr Paulita Fujita to discuss further treatment with steroids in setting of ulcers   Procedures:  Capsule study  EGD  Consultations:  GI  Discharge Exam: Filed Weights    04/08/15 1040 04/08/15 1325 04/09/15 0856  Weight: 66.679 kg (147 lb) 65 kg (143 lb 4.8 oz) 57.153 kg (126 lb)   Filed Vitals:   04/13/15 0258 04/13/15 0551  BP: 138/78 143/74  Pulse: 81 81  Temp: 98.1 F (36.7 C) 98.2 F (36.8 C)  Resp: 18 18    General: AAO x 3, no distress Cardiovascular: RRR, no murmurs  Respiratory: clear to auscultation bilaterally GI: soft, non-tender, non-distended, bowel sound positive  Discharge Instructions You were cared for by a hospitalist during your hospital stay. If you have any questions about your discharge medications or the care you received while you were in the hospital after you are discharged, you can call the unit and asked to speak with the hospitalist on call if the hospitalist that took care of you is not available. Once you are discharged, your primary care physician will handle any further medical issues. Please note that NO REFILLS for any discharge medications will be authorized once you are discharged, as it is imperative that you return to your primary care physician (or establish a relationship with a primary care physician if you do not have one) for your aftercare needs so that they can reassess your need for medications and monitor your lab values.  Discharge Instructions    Diet - low sodium heart healthy    Complete by:  As directed      Increase activity slowly    Complete by:  As directed             Medication List    TAKE these medications        feeding supplement (ENSURE ENLIVE) Liqd  Take 237 mLs by mouth 3 (three) times daily.     pantoprazole 40 MG tablet  Commonly known as:  PROTONIX  Take 1 tablet (40 mg total) by mouth 2 (two) times daily before a meal.     predniSONE 10 MG tablet  Commonly known as:  DELTASONE  Take 2 tablets (20 mg total) by mouth daily with breakfast.     sucralfate 1 g tablet  Commonly known as:  CARAFATE  Take 1 tablet (1 g total) by mouth 4 (four) times daily -  with meals  and at bedtime.       No Known Allergies    The results of significant diagnostics from this hospitalization (including imaging, microbiology, ancillary and laboratory) are listed below for reference.    Significant Diagnostic Studies: Ct Soft Tissue Neck Wo Contrast  03/23/2015  CLINICAL DATA:  82 year old male with dysphagia. Poor renal function. Initial encounter. EXAM: CT NECK WITHOUT CONTRAST TECHNIQUE: Multidetector CT imaging of the neck was performed following the standard protocol without intravenous contrast. COMPARISON:  None. FINDINGS: Pharynx and larynx: No mass or inflammation noted on this unenhanced exam. Coaptation membranes left fossa of Rosenmuller felt to be an incidental finding. Upper thoracic esophagus under distended and evaluation limited. Salivary glands: No primary worrisome mass or inflammation. Thyroid:  Motion degraded without obvious mass. Lymph nodes: No adenopathy. Vascular: Limited evaluation without contrast. Left carotid bifurcation calcifications noted. Limited intracranial: Limited imaging unremarkable. Visualized orbits: Limited imaging  unremarkable. Mastoids and visualized paranasal sinuses: Limited imaging unremarkable. Skeleton: Cervical spondylotic changes without bony destructive lesion. Upper chest: Negative. IMPRESSION: No mass or inflammation noted on this unenhanced exam. Electronically Signed   By: Genia Del M.D.   On: 03/23/2015 19:22   Dg Chest Portable 1 View  04/08/2015  CLINICAL DATA:  Shortness of breath for 2 days EXAM: PORTABLE CHEST 1 VIEW COMPARISON:  04/02/2015 FINDINGS: The heart size and mediastinal contours are within normal limits. Both lungs are clear. The visualized skeletal structures are unremarkable. IMPRESSION: No active disease. Electronically Signed   By: Inez Catalina M.D.   On: 04/08/2015 10:47   Dg Chest Portable 1 View  04/02/2015  CLINICAL DATA:  Shortness of breath for several days EXAM: PORTABLE CHEST 1 VIEW  COMPARISON:  None. FINDINGS: Lungs are clear. Heart size and pulmonary vascularity are normal. No adenopathy. No bone lesions. IMPRESSION: No edema or consolidation. Electronically Signed   By: Lowella Grip III M.D.   On: 04/02/2015 09:17    Microbiology: Recent Results (from the past 240 hour(s))  Herpes simplex virus culture     Status: None (Preliminary result)   Collection Time: 04/10/15  6:23 PM  Result Value Ref Range Status   Specimen Description GASTRIC GASTRIC BIOPSY  Final   Special Requests NONE  Final   Culture   Final    Culture has been initiated. Performed at [facility]    Report Status PENDING  Incomplete     Labs: Basic Metabolic Panel:  Recent Labs Lab 04/09/15 0451 04/10/15 0444 04/11/15 0810 04/12/15 0424 04/13/15 0511  NA 142 143 142 141 139  K 5.3* 5.5* 4.5 4.2 3.9  CL 116* 117* 114* 115* 111  CO2 20* 18* 21* 19* 20*  GLUCOSE 139* 140* 108* 101* 95  BUN 82* 81* 58* 44* 37*  CREATININE 2.05* 1.86* 2.02* 1.92* 1.71*  CALCIUM 7.8* 7.9* 8.0* 7.9* 8.1*   Liver Function Tests:  Recent Labs Lab 04/08/15 1040  AST 50*  ALT 100*  ALKPHOS 53  BILITOT 1.0  PROT 4.8*  ALBUMIN 2.4*   No results for input(s): LIPASE, AMYLASE in the last 168 hours. No results for input(s): AMMONIA in the last 168 hours. CBC:  Recent Labs Lab 04/08/15 1040  04/10/15 0444 04/10/15 0800 04/10/15 2014 04/11/15 0810 04/12/15 0424 04/13/15 0511  WBC 15.7*  < > 18.5* 22.3*  --  16.4* 15.1* 12.8*  NEUTROABS 12.6*  --   --   --   --   --   --   --   HGB 6.8*  < > 6.9* 8.7* 12.1* 11.0* 10.5* 11.3*  HCT 20.6*  < > 19.9* 25.6* 34.8* 31.4* 30.5* 32.4*  MCV 98.1  < > 88.4 88.3  --  87.7 90.2 90.5  PLT 137*  < > 107* 125*  --  85* 75* 92*  < > = values in this interval not displayed. Cardiac Enzymes: No results for input(s): CKTOTAL, CKMB, CKMBINDEX, TROPONINI in the last 168 hours. BNP: BNP (last 3 results) No results for input(s): BNP in the last 8760  hours.  ProBNP (last 3 results) No results for input(s): PROBNP in the last 8760 hours.  CBG: No results for input(s): GLUCAP in the last 168 hours.     SignedDebbe Odea, MD [facility] 04/13/2015, 8:58 AM

## 2015-04-14 ENCOUNTER — Encounter (HOSPITAL_COMMUNITY): Payer: Self-pay | Admitting: Gastroenterology

## 2015-04-15 DIAGNOSIS — K259 Gastric ulcer, unspecified as acute or chronic, without hemorrhage or perforation: Secondary | ICD-10-CM | POA: Diagnosis not present

## 2015-04-15 DIAGNOSIS — R600 Localized edema: Secondary | ICD-10-CM | POA: Diagnosis not present

## 2015-04-15 DIAGNOSIS — K754 Autoimmune hepatitis: Secondary | ICD-10-CM | POA: Diagnosis not present

## 2015-04-21 DIAGNOSIS — R627 Adult failure to thrive: Secondary | ICD-10-CM | POA: Diagnosis not present

## 2015-04-21 DIAGNOSIS — K754 Autoimmune hepatitis: Secondary | ICD-10-CM | POA: Diagnosis not present

## 2015-04-21 DIAGNOSIS — D696 Thrombocytopenia, unspecified: Secondary | ICD-10-CM | POA: Diagnosis not present

## 2015-04-21 DIAGNOSIS — K279 Peptic ulcer, site unspecified, unspecified as acute or chronic, without hemorrhage or perforation: Secondary | ICD-10-CM | POA: Diagnosis not present

## 2015-04-21 DIAGNOSIS — D62 Acute posthemorrhagic anemia: Secondary | ICD-10-CM | POA: Diagnosis not present

## 2015-04-21 DIAGNOSIS — E46 Unspecified protein-calorie malnutrition: Secondary | ICD-10-CM | POA: Diagnosis not present

## 2015-04-21 DIAGNOSIS — N051 Unspecified nephritic syndrome with focal and segmental glomerular lesions: Secondary | ICD-10-CM | POA: Diagnosis not present

## 2015-05-14 ENCOUNTER — Ambulatory Visit: Payer: Commercial Managed Care - HMO | Admitting: Dietician

## 2015-06-02 DIAGNOSIS — D649 Anemia, unspecified: Secondary | ICD-10-CM | POA: Diagnosis not present

## 2015-06-02 DIAGNOSIS — K259 Gastric ulcer, unspecified as acute or chronic, without hemorrhage or perforation: Secondary | ICD-10-CM | POA: Diagnosis not present

## 2015-06-02 DIAGNOSIS — K754 Autoimmune hepatitis: Secondary | ICD-10-CM | POA: Diagnosis not present

## 2015-06-09 ENCOUNTER — Encounter: Payer: Commercial Managed Care - HMO | Attending: Family Medicine | Admitting: Dietician

## 2015-06-09 ENCOUNTER — Encounter: Payer: Self-pay | Admitting: Dietician

## 2015-06-09 VITALS — Ht 64.0 in | Wt 143.0 lb

## 2015-06-09 DIAGNOSIS — K754 Autoimmune hepatitis: Secondary | ICD-10-CM | POA: Diagnosis not present

## 2015-06-09 DIAGNOSIS — B1920 Unspecified viral hepatitis C without hepatic coma: Secondary | ICD-10-CM

## 2015-06-09 DIAGNOSIS — N289 Disorder of kidney and ureter, unspecified: Secondary | ICD-10-CM

## 2015-06-09 DIAGNOSIS — N183 Chronic kidney disease, stage 3 (moderate): Secondary | ICD-10-CM | POA: Insufficient documentation

## 2015-06-09 DIAGNOSIS — R627 Adult failure to thrive: Secondary | ICD-10-CM | POA: Insufficient documentation

## 2015-06-09 NOTE — Patient Instructions (Signed)
Continue to follow a low sodium diet. Work to maintain your current weight.    Increase the Ensure (or [employer]) when your weight decreases or you are unable to eat.   Do not skip meals.  Choose breakfast, lunch, dinner daily and 1-2 snacks daily  Focus on foods with a lot of nutrition such as lean meat, starches, vegetables and fruits to get the vitamins and minerals that your body needs. Protect yourself against foodborne illness.

## 2015-06-09 NOTE — Progress Notes (Signed)
Medical Nutrition Therapy:  Appt start time: 0935 end time:  1025.   Assessment:  Primary concerns today: James Hill is here alone.  He states that he is here to see what he should eat with hepatitis.  Referral noted for failure to thrive and malnutrition.  Per patient he was put on prednisone for hepatitis C and he lost weight down to 126 lbs on this as he was not hungry.  He was drinking Ensure at that time.  He has gained to 143 lbs today.  Results of Nutrition focussed physical exam showed decrease muscle mass in scapular area as well as decreased body fat on arms.  He reports weakness in his right leg/hip which has changed walking slightly.  He has decreased stamina and increased shortness of breath but has been resuming activity.  Hx also includes stage 3 CKD with noted GFR of 42.  Bun:  37, Creatinine 1.71, Potassium 3.9 (04/13/15).  Weight hx: Highest weight 155 Lowest 126 lbs Current 143 lbs.  Patient lives alone. His granddaughter lives down the street from him and checks on him.  Patient does his own shopping and cooking.   Preferred Learning Style:   No preference indicated   Learning Readiness:   Ready  Change in progress  MEDICATIONS: see list   DIETARY INTAKE:  Usual eating pattern includes 3 meals and 2 snacks per day.  Everyday foods include chicken, fish, vegetables.  Avoided foods include fried food and does not eat out much now, decreased red meat and pork.    24-hr recall:  B (6-8 AM): instant oatmeal, milk, OR hash brown, egg OR cheerios or raisin bran and 2% milk  Snk ( AM): none  L (11-2PM): vegetable, chicken or fish, and occasional  pasta, or mashed potato Snk ( PM): 2 cups ice cream (but recently decreasing) D (PM): sandwich occasionally or leftovers Snk ( PM): used to eat a lot of sweets but does not snack at night now Beverages:  Water, milk with the prednisone and occasional juice and rare Root beer float, soda  Usual physical activity: He walked 1  1/2 hours per day prior to being on prednisone.  He lost weight and is now short of breath and now only walks 1 hour per day (split)  Estimated energy needs: 1800 calories 65-75 g protein  Progress Towards Goal(s):  In progress.   Nutritional Diagnosis:  NB-1.1 Food and nutrition-related knowledge deficit As related to nutrition for hepatitis and CKD.  As evidenced by patient report.    Intervention:  Nutrition counseling/education related to a low sodium diet.  Discussed the importance of high quality nutrition and to continue regular meals supplementing as needed if intake decreases.    Continue to follow a low sodium diet. Work to maintain your current weight.    Increase the Ensure (or [employer]) when your weight decreases or you are unable to eat.   Do not skip meals.  Choose breakfast, lunch, dinner daily and 1-2 snacks daily  Focus on foods with a lot of nutrition such as lean meat, starches, vegetables and fruits to get the vitamins and minerals that your body needs. Protect yourself against foodborne illness.  Teaching Method Utilized:  Visual Auditory Hands on  Handouts given during visit include:  Hepatitis nutrition therapy from AND  Low Sodium Nutrition therapy from AND  Barriers to learning/adherence to lifestyle change: none  Demonstrated degree of understanding via:  Teach Back   Monitoring/Evaluation:  Dietary intake, exercise, label  reading, and body weight prn.

## 2015-06-10 ENCOUNTER — Encounter (HOSPITAL_COMMUNITY): Payer: Self-pay

## 2015-06-10 DIAGNOSIS — D649 Anemia, unspecified: Secondary | ICD-10-CM | POA: Diagnosis not present

## 2015-06-11 DIAGNOSIS — K754 Autoimmune hepatitis: Secondary | ICD-10-CM | POA: Diagnosis not present

## 2015-06-11 DIAGNOSIS — D649 Anemia, unspecified: Secondary | ICD-10-CM | POA: Diagnosis not present

## 2015-09-08 DIAGNOSIS — M79604 Pain in right leg: Secondary | ICD-10-CM | POA: Diagnosis not present

## 2015-09-08 DIAGNOSIS — D649 Anemia, unspecified: Secondary | ICD-10-CM | POA: Diagnosis not present

## 2015-09-08 DIAGNOSIS — K754 Autoimmune hepatitis: Secondary | ICD-10-CM | POA: Diagnosis not present

## 2015-09-18 DIAGNOSIS — D649 Anemia, unspecified: Secondary | ICD-10-CM | POA: Diagnosis not present

## 2015-09-24 DIAGNOSIS — R809 Proteinuria, unspecified: Secondary | ICD-10-CM | POA: Diagnosis not present

## 2015-09-24 DIAGNOSIS — N183 Chronic kidney disease, stage 3 (moderate): Secondary | ICD-10-CM | POA: Diagnosis not present

## 2015-09-24 DIAGNOSIS — N051 Unspecified nephritic syndrome with focal and segmental glomerular lesions: Secondary | ICD-10-CM | POA: Diagnosis not present

## 2015-09-24 DIAGNOSIS — K754 Autoimmune hepatitis: Secondary | ICD-10-CM | POA: Diagnosis not present

## 2015-09-24 DIAGNOSIS — N179 Acute kidney failure, unspecified: Secondary | ICD-10-CM | POA: Diagnosis not present

## 2015-09-24 DIAGNOSIS — Z7952 Long term (current) use of systemic steroids: Secondary | ICD-10-CM | POA: Diagnosis not present

## 2015-09-24 DIAGNOSIS — I129 Hypertensive chronic kidney disease with stage 1 through stage 4 chronic kidney disease, or unspecified chronic kidney disease: Secondary | ICD-10-CM | POA: Diagnosis not present

## 2015-09-24 DIAGNOSIS — I10 Essential (primary) hypertension: Secondary | ICD-10-CM | POA: Insufficient documentation

## 2015-10-08 DIAGNOSIS — N183 Chronic kidney disease, stage 3 (moderate): Secondary | ICD-10-CM | POA: Diagnosis not present

## 2015-10-08 DIAGNOSIS — K754 Autoimmune hepatitis: Secondary | ICD-10-CM | POA: Diagnosis not present

## 2015-10-08 DIAGNOSIS — D508 Other iron deficiency anemias: Secondary | ICD-10-CM | POA: Diagnosis not present

## 2015-10-08 DIAGNOSIS — N179 Acute kidney failure, unspecified: Secondary | ICD-10-CM | POA: Diagnosis not present

## 2015-10-08 DIAGNOSIS — I129 Hypertensive chronic kidney disease with stage 1 through stage 4 chronic kidney disease, or unspecified chronic kidney disease: Secondary | ICD-10-CM | POA: Diagnosis not present

## 2015-10-08 DIAGNOSIS — R809 Proteinuria, unspecified: Secondary | ICD-10-CM | POA: Insufficient documentation

## 2015-10-08 DIAGNOSIS — E559 Vitamin D deficiency, unspecified: Secondary | ICD-10-CM | POA: Diagnosis not present

## 2015-10-08 DIAGNOSIS — N051 Unspecified nephritic syndrome with focal and segmental glomerular lesions: Secondary | ICD-10-CM | POA: Diagnosis not present

## 2015-10-08 DIAGNOSIS — D509 Iron deficiency anemia, unspecified: Secondary | ICD-10-CM | POA: Insufficient documentation

## 2015-10-19 DIAGNOSIS — N179 Acute kidney failure, unspecified: Secondary | ICD-10-CM | POA: Diagnosis not present

## 2015-10-19 DIAGNOSIS — N183 Chronic kidney disease, stage 3 (moderate): Secondary | ICD-10-CM | POA: Diagnosis not present

## 2015-11-06 DIAGNOSIS — K754 Autoimmune hepatitis: Secondary | ICD-10-CM | POA: Diagnosis not present

## 2015-11-06 DIAGNOSIS — N189 Chronic kidney disease, unspecified: Secondary | ICD-10-CM | POA: Diagnosis not present

## 2015-11-10 DIAGNOSIS — N183 Chronic kidney disease, stage 3 (moderate): Secondary | ICD-10-CM | POA: Diagnosis not present

## 2015-11-10 DIAGNOSIS — I129 Hypertensive chronic kidney disease with stage 1 through stage 4 chronic kidney disease, or unspecified chronic kidney disease: Secondary | ICD-10-CM | POA: Diagnosis not present

## 2015-11-10 DIAGNOSIS — N051 Unspecified nephritic syndrome with focal and segmental glomerular lesions: Secondary | ICD-10-CM | POA: Diagnosis not present

## 2015-11-13 DIAGNOSIS — K2970 Gastritis, unspecified, without bleeding: Secondary | ICD-10-CM | POA: Diagnosis not present

## 2015-11-13 DIAGNOSIS — K209 Esophagitis, unspecified: Secondary | ICD-10-CM | POA: Diagnosis not present

## 2015-11-13 DIAGNOSIS — Z86010 Personal history of colonic polyps: Secondary | ICD-10-CM | POA: Diagnosis not present

## 2015-11-13 DIAGNOSIS — K3189 Other diseases of stomach and duodenum: Secondary | ICD-10-CM | POA: Diagnosis not present

## 2015-11-13 DIAGNOSIS — K648 Other hemorrhoids: Secondary | ICD-10-CM | POA: Diagnosis not present

## 2015-11-13 DIAGNOSIS — K5730 Diverticulosis of large intestine without perforation or abscess without bleeding: Secondary | ICD-10-CM | POA: Diagnosis not present

## 2015-11-13 DIAGNOSIS — K253 Acute gastric ulcer without hemorrhage or perforation: Secondary | ICD-10-CM | POA: Diagnosis not present

## 2015-11-13 DIAGNOSIS — D509 Iron deficiency anemia, unspecified: Secondary | ICD-10-CM | POA: Diagnosis not present

## 2015-12-15 DIAGNOSIS — K754 Autoimmune hepatitis: Secondary | ICD-10-CM | POA: Diagnosis not present

## 2015-12-15 DIAGNOSIS — D649 Anemia, unspecified: Secondary | ICD-10-CM | POA: Diagnosis not present

## 2015-12-29 DIAGNOSIS — K754 Autoimmune hepatitis: Secondary | ICD-10-CM | POA: Diagnosis not present

## 2015-12-29 DIAGNOSIS — D649 Anemia, unspecified: Secondary | ICD-10-CM | POA: Diagnosis not present

## 2016-01-14 DIAGNOSIS — R748 Abnormal levels of other serum enzymes: Secondary | ICD-10-CM | POA: Diagnosis not present

## 2016-01-19 ENCOUNTER — Telehealth: Payer: Self-pay

## 2016-01-19 DIAGNOSIS — I10 Essential (primary) hypertension: Secondary | ICD-10-CM | POA: Diagnosis not present

## 2016-01-19 DIAGNOSIS — Z23 Encounter for immunization: Secondary | ICD-10-CM | POA: Diagnosis not present

## 2016-01-19 DIAGNOSIS — M545 Low back pain: Secondary | ICD-10-CM | POA: Diagnosis not present

## 2016-01-19 DIAGNOSIS — K754 Autoimmune hepatitis: Secondary | ICD-10-CM | POA: Diagnosis not present

## 2016-01-19 DIAGNOSIS — Z0000 Encounter for general adult medical examination without abnormal findings: Secondary | ICD-10-CM | POA: Diagnosis not present

## 2016-01-19 DIAGNOSIS — R0609 Other forms of dyspnea: Secondary | ICD-10-CM | POA: Diagnosis not present

## 2016-01-19 NOTE — Telephone Encounter (Signed)
Sent NOTES TO SCHEDULING

## 2016-02-05 DIAGNOSIS — D649 Anemia, unspecified: Secondary | ICD-10-CM | POA: Diagnosis not present

## 2016-02-05 DIAGNOSIS — K754 Autoimmune hepatitis: Secondary | ICD-10-CM | POA: Diagnosis not present

## 2016-02-05 DIAGNOSIS — N183 Chronic kidney disease, stage 3 (moderate): Secondary | ICD-10-CM | POA: Diagnosis not present

## 2016-02-09 DIAGNOSIS — K219 Gastro-esophageal reflux disease without esophagitis: Secondary | ICD-10-CM | POA: Diagnosis not present

## 2016-02-09 DIAGNOSIS — M545 Low back pain: Secondary | ICD-10-CM | POA: Diagnosis not present

## 2016-02-10 DIAGNOSIS — D509 Iron deficiency anemia, unspecified: Secondary | ICD-10-CM | POA: Diagnosis not present

## 2016-02-10 DIAGNOSIS — N183 Chronic kidney disease, stage 3 (moderate): Secondary | ICD-10-CM | POA: Diagnosis not present

## 2016-02-10 DIAGNOSIS — E559 Vitamin D deficiency, unspecified: Secondary | ICD-10-CM | POA: Diagnosis not present

## 2016-02-10 DIAGNOSIS — I129 Hypertensive chronic kidney disease with stage 1 through stage 4 chronic kidney disease, or unspecified chronic kidney disease: Secondary | ICD-10-CM | POA: Diagnosis not present

## 2016-02-10 DIAGNOSIS — N051 Unspecified nephritic syndrome with focal and segmental glomerular lesions: Secondary | ICD-10-CM | POA: Diagnosis not present

## 2016-02-10 DIAGNOSIS — Z7952 Long term (current) use of systemic steroids: Secondary | ICD-10-CM | POA: Diagnosis not present

## 2016-02-10 DIAGNOSIS — R808 Other proteinuria: Secondary | ICD-10-CM | POA: Diagnosis not present

## 2016-02-10 DIAGNOSIS — K754 Autoimmune hepatitis: Secondary | ICD-10-CM | POA: Diagnosis not present

## 2016-02-19 ENCOUNTER — Encounter (INDEPENDENT_AMBULATORY_CARE_PROVIDER_SITE_OTHER): Payer: Self-pay

## 2016-02-19 ENCOUNTER — Ambulatory Visit (INDEPENDENT_AMBULATORY_CARE_PROVIDER_SITE_OTHER): Payer: Medicare HMO | Admitting: Internal Medicine

## 2016-02-19 ENCOUNTER — Encounter: Payer: Self-pay | Admitting: Internal Medicine

## 2016-02-19 VITALS — BP 122/70 | HR 105 | Ht 64.0 in | Wt 147.0 lb

## 2016-02-19 DIAGNOSIS — R000 Tachycardia, unspecified: Secondary | ICD-10-CM

## 2016-02-19 DIAGNOSIS — R0602 Shortness of breath: Secondary | ICD-10-CM | POA: Diagnosis not present

## 2016-02-19 NOTE — Progress Notes (Signed)
New Outpatient Visit Date: 02/19/2016  Referring Provider: Aretta Nip, MD [place], [place] [address]  Chief Complaint: Shortness of breath  HPI:  James Hill is a 83 y.o. 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 Radene Ou for evaluation of shortness of breath.  Dyspnea began after James Hill was hospitalized several times in 03/2015; he had been started on prednisone shortly before this due to his hepatitis.  Complicating factors include acute blood loss anemia necessitating blood transfusion and prolonged hospitalization.  His steroids have been progressively taped over the last several months; he is currently on prednisone 2.5 mg daily.  [name] and his granddaughter happily report that the patient's dyspnea has improved significantly with declining doses of prednisone.  He is back to his baseline, walking 5-10 miles a day, as weather allows.  He is able to walk 5 miles in about 1.5 hours.  He denies chest pain, palpitations, edema, orthopnea, PND, and lightheadedness.  Mr. Ridley states that he was told about a heart murmur as a young man (~83 years old) and treated with a medication that "cleared everything up."  He has not had any further cardiovascular testing since that time.   --------------------------------------------------------------------------------------------------  Cardiovascular History & Procedures: Cardiovascular Problems:  Dyspnea on exertion  Risk Factors:  Hypertension, hyperlipidemia, male gender, and age > 42  Cath/PCI:  None  CV Surgery:  None  EP Procedures and Devices:  None  Non-Invasive Evaluation(s):  None  Recent CV Pertinent Labs: Lab Results  Component Value Date   INR 1.33 04/02/2015   K 3.9 04/13/2015   MG 1.9 04/02/2015   BUN 37 (H)  04/13/2015   CREATININE 1.71 (H) 04/13/2015    --------------------------------------------------------------------------------------------------  Past Medical History:  Diagnosis Date  . Autoimmune hepatitis treated with steroids ([place]) 04/02/2015  . CKD (chronic kidney disease), stage III 03/23/2015  . Erosive esophagitis   . GI bleed 04/08/2015  . Hepatitis C   . History of blood transfusion   . Hyperlipidemia   . Hypertension   . PUD (peptic ulcer disease)   . Renal disorder   . Renal insufficiency   . Thrombocytopenia ([place]) 03/23/2015    Past Surgical History:  Procedure Laterality Date  . ESOPHAGOGASTRODUODENOSCOPY N/A 04/02/2015   Procedure: ESOPHAGOGASTRODUODENOSCOPY (EGD);  Surgeon: Teena Irani, MD;  Location: [facility];  Service: Endoscopy;  Laterality: N/A;  . ESOPHAGOGASTRODUODENOSCOPY N/A 04/10/2015   Procedure: ESOPHAGOGASTRODUODENOSCOPY (EGD);  Surgeon: Wilford Corner, MD;  Location: [facility];  Service: Endoscopy;  Laterality: N/A;  . ESOPHAGOSCOPY  03/25/2015   Procedure: ESOPHAGOSCOPY;  Surgeon: Teena Irani, MD;  Location: [facility]COPY;  Service: Endoscopy;;  . GIVENS CAPSULE STUDY N/A 04/09/2015   Procedure: GIVENS CAPSULE STUDY;  Surgeon: Ronald Lobo, MD;  Location: [facility] ENDOSCOPY;  Service: Endoscopy;  Laterality: N/A;    Outpatient Encounter Prescriptions as of 02/19/2016  Medication Sig  . amLODipine (NORVASC) 5 MG tablet Take 5 mg by mouth.  . azaTHIOprine (IMURAN) 50 MG tablet Take 50 mg by mouth.  . Cholecalciferol (VITAMIN D-1000 MAX ST) 1000 units tablet Take 1,000 mg by mouth daily.  . cyclobenzaprine (FLEXERIL) 10 MG tablet Take 10 mg by mouth.  . losartan (COZAAR) 25 MG tablet Take 25 mg by mouth.  . predniSONE (DELTASONE) 5 MG tablet Take 2.5 mg by mouth.  . [DISCONTINUED] feeding supplement,  ENSURE ENLIVE, (ENSURE ENLIVE) LIQD Take 237 mLs by mouth 3 (three) times daily. (Patient taking differently: Take 237 mLs by mouth 3 (three) times daily. He is  now taking 2-3 times per week.)  . [DISCONTINUED] pantoprazole (PROTONIX) 40 MG tablet Take 1 tablet (40 mg total) by mouth 2 (two) times daily before a meal.  . [DISCONTINUED] predniSONE (DELTASONE) 10 MG tablet Take 2 tablets (20 mg total) by mouth daily with breakfast. (Patient not taking: Reported on 02/19/2016)  . [DISCONTINUED] sucralfate (CARAFATE) 1 g tablet Take 1 tablet (1 g total) by mouth 4 (four) times daily -  with meals and at bedtime. (Patient not taking: Reported on 06/09/2015)   No facility-administered encounter medications on file as of 02/19/2016.     Allergies: Patient has no known allergies.  Social History   Social History  . Marital status: Legally Separated    Spouse name: N/A  . Number of children: N/A  . Years of education: N/A   Occupational History  . Not on file.   Social History Main Topics  . Smoking status: Never Smoker  . Smokeless tobacco: Never Used  . Alcohol use Yes     Comment: drinks 3x/year  . Drug use: No  . Sexual activity: No   Other Topics Concern  . Not on file   Social History Narrative  . No narrative on file    Family History  Problem Relation Age of Onset  . Hypertension Mother   . Hypertension Father   . Hypertension Sister   . Stroke Sister     Review of Systems: A 12-system review of systems was performed and was negative except as noted in the HPI.  --------------------------------------------------------------------------------------------------  Physical Exam: BP 122/70   Pulse (!) 105   Ht 5\' 4"  (1.626 m)   Wt 147 lb (66.7 kg)   BMI 25.23 kg/m   General:  Well-developed, well-nourished man, seated comfortably in the exam room.  He appears younger than his stated age.  He is accompanied by his granddaughter. HEENT: No conjunctival pallor or scleral icterus.  Moist mucous membranes.  OP clear. Neck: Supple without lymphadenopathy, thyromegaly, JVD, or HJR.  No carotid bruit. Lungs: Normal work of breathing.   Clear to auscultation bilaterally without wheezes or crackles. Heart: Tachycardic but regular without murmurs, rubs, or gallops.  Non-displaced PMI. Abd: Bowel sounds present.  Soft, NT/ND without hepatosplenomegaly Ext: No lower extremity edema.  Radial, PT, and DP pulses are 2+ bilaterally Skin: warm and dry without rash Neuro: CNIII-XII intact.  Strength and fine-touch sensation intact in upper and lower extremities bilaterally. Psych: Normal mood and affect.  EKG:  Sinus tachycardia without significant abnormalities.  Compared with prior tracing from 04/08/15, PACs are no longer present (I have personally reviewed both tracings).  Lab Results  Component Value Date   WBC 12.8 (H) 04/13/2015   HGB 11.3 (L) 04/13/2015   HCT 32.4 (L) 04/13/2015   MCV 90.5 04/13/2015   PLT 92 (L) 04/13/2015    Lab Results  Component Value Date   NA 139 04/13/2015   K 3.9 04/13/2015   CL 111 04/13/2015   CO2 20 (L) 04/13/2015   BUN 37 (H) 04/13/2015   CREATININE 1.71 (H) 04/13/2015   GLUCOSE 95 04/13/2015   ALT 100 (H) 04/08/2015    --------------------------------------------------------------------------------------------------  ASSESSMENT AND PLAN: Dyspnea on exertion This began almost 1 year ago around the time that he was started on steroids for his autoimmune hepatitis complicated by esophagitis  and ulcers with significant blood loss.  He reports gradual improvement in his breathing over the last 10 months, which has returned to his baseline.  He reports walking at least 5 miles/day without limitations.  Exam today is unremarkable with the exception of mild sinus tachycardia.  We have agreed to obtain a transthoracic echocardiogram.  If this is normal, I would defer any further cardiac workup unless his breathing worsens or new symptoms such as chest pain or palpitations develop.  Hypertension Blood pressure is well-controlled today.  We will not make any medication changes at this  time.  Follow-up: Return to clinic in 3 months.  Nelva Bush, MD 02/19/2016 3:40 PM

## 2016-02-19 NOTE — Patient Instructions (Signed)
Medication Instructions:  Your physician recommends that you continue on your current medications as directed. Please refer to the Current Medication list given to you today.   Labwork: None   Testing/Procedures: Your physician has requested that you have an echocardiogram. Echocardiography is a painless test that uses sound waves to create images of your heart. It provides your doctor with information about the size and shape of your heart and how well your heart's chambers and valves are working. This procedure takes approximately one hour. There are no restrictions for this procedure.    Follow-Up: Your physician recommends that you schedule a follow-up appointment in: 3 months with Dr End.     If you need a refill on your cardiac medications before your next appointment, please call your pharmacy.

## 2016-02-23 ENCOUNTER — Ambulatory Visit (HOSPITAL_COMMUNITY): Payer: Medicare HMO | Attending: Cardiology

## 2016-02-23 ENCOUNTER — Other Ambulatory Visit: Payer: Self-pay

## 2016-02-23 DIAGNOSIS — I129 Hypertensive chronic kidney disease with stage 1 through stage 4 chronic kidney disease, or unspecified chronic kidney disease: Secondary | ICD-10-CM | POA: Insufficient documentation

## 2016-02-23 DIAGNOSIS — N189 Chronic kidney disease, unspecified: Secondary | ICD-10-CM | POA: Diagnosis not present

## 2016-02-23 DIAGNOSIS — R000 Tachycardia, unspecified: Secondary | ICD-10-CM | POA: Diagnosis not present

## 2016-02-23 DIAGNOSIS — R0602 Shortness of breath: Secondary | ICD-10-CM | POA: Diagnosis not present

## 2016-02-23 DIAGNOSIS — I348 Other nonrheumatic mitral valve disorders: Secondary | ICD-10-CM | POA: Diagnosis not present

## 2016-03-07 DIAGNOSIS — R748 Abnormal levels of other serum enzymes: Secondary | ICD-10-CM | POA: Diagnosis not present

## 2016-04-22 DIAGNOSIS — K754 Autoimmune hepatitis: Secondary | ICD-10-CM | POA: Diagnosis not present

## 2016-04-22 DIAGNOSIS — R748 Abnormal levels of other serum enzymes: Secondary | ICD-10-CM | POA: Diagnosis not present

## 2016-04-29 ENCOUNTER — Encounter: Payer: Self-pay | Admitting: Internal Medicine

## 2016-05-10 DIAGNOSIS — N051 Unspecified nephritic syndrome with focal and segmental glomerular lesions: Secondary | ICD-10-CM | POA: Diagnosis not present

## 2016-05-10 DIAGNOSIS — R808 Other proteinuria: Secondary | ICD-10-CM | POA: Diagnosis not present

## 2016-05-10 DIAGNOSIS — D508 Other iron deficiency anemias: Secondary | ICD-10-CM | POA: Diagnosis not present

## 2016-05-10 DIAGNOSIS — I129 Hypertensive chronic kidney disease with stage 1 through stage 4 chronic kidney disease, or unspecified chronic kidney disease: Secondary | ICD-10-CM | POA: Diagnosis not present

## 2016-05-10 DIAGNOSIS — N183 Chronic kidney disease, stage 3 (moderate): Secondary | ICD-10-CM | POA: Diagnosis not present

## 2016-05-19 ENCOUNTER — Ambulatory Visit: Payer: Medicare HMO | Admitting: Internal Medicine

## 2016-05-24 DIAGNOSIS — D649 Anemia, unspecified: Secondary | ICD-10-CM | POA: Diagnosis not present

## 2016-05-24 DIAGNOSIS — K754 Autoimmune hepatitis: Secondary | ICD-10-CM | POA: Diagnosis not present

## 2016-05-31 DIAGNOSIS — N051 Unspecified nephritic syndrome with focal and segmental glomerular lesions: Secondary | ICD-10-CM | POA: Diagnosis not present

## 2016-05-31 DIAGNOSIS — E7800 Pure hypercholesterolemia, unspecified: Secondary | ICD-10-CM | POA: Diagnosis not present

## 2016-05-31 DIAGNOSIS — K754 Autoimmune hepatitis: Secondary | ICD-10-CM | POA: Diagnosis not present

## 2016-05-31 DIAGNOSIS — I10 Essential (primary) hypertension: Secondary | ICD-10-CM | POA: Diagnosis not present

## 2016-08-08 DIAGNOSIS — N183 Chronic kidney disease, stage 3 (moderate): Secondary | ICD-10-CM | POA: Diagnosis not present

## 2016-08-09 DIAGNOSIS — N051 Unspecified nephritic syndrome with focal and segmental glomerular lesions: Secondary | ICD-10-CM | POA: Diagnosis not present

## 2016-08-09 DIAGNOSIS — R808 Other proteinuria: Secondary | ICD-10-CM | POA: Diagnosis not present

## 2016-08-09 DIAGNOSIS — N183 Chronic kidney disease, stage 3 (moderate): Secondary | ICD-10-CM | POA: Diagnosis not present

## 2016-08-09 DIAGNOSIS — E559 Vitamin D deficiency, unspecified: Secondary | ICD-10-CM | POA: Diagnosis not present

## 2016-08-09 DIAGNOSIS — I129 Hypertensive chronic kidney disease with stage 1 through stage 4 chronic kidney disease, or unspecified chronic kidney disease: Secondary | ICD-10-CM | POA: Diagnosis not present

## 2016-08-30 DIAGNOSIS — K754 Autoimmune hepatitis: Secondary | ICD-10-CM | POA: Diagnosis not present

## 2016-09-30 IMAGING — US US ABDOMEN LIMITED
1 series · 14 of 25 positions shown · non-contrast
Comparison: None.

CLINICAL DATA: Elevated serum transaminase levels.

EXAM:
US ABDOMEN LIMITED - RIGHT UPPER QUADRANT

[Series 1: us abdomen limited · 0.26mm/px · 14 of 37 slices shown]
[im 1/37]
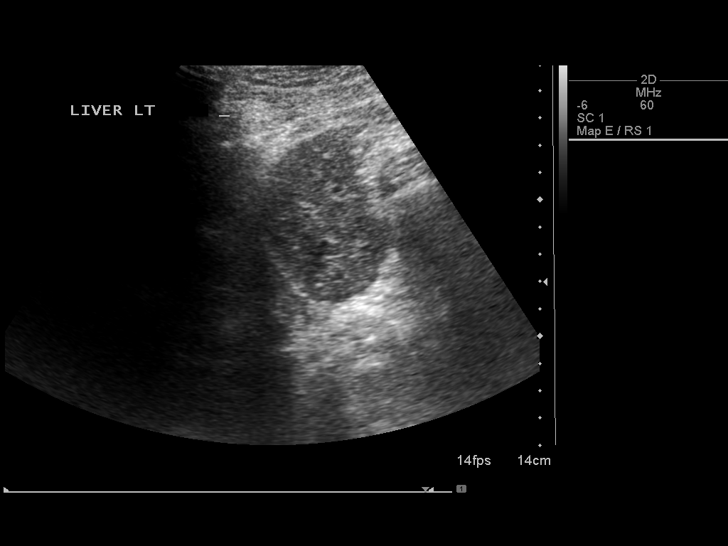
[im 4/37]
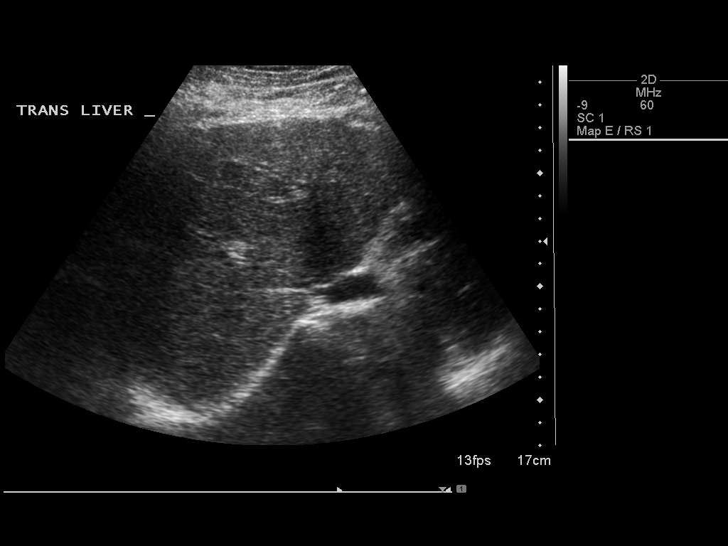
[im 7/37]
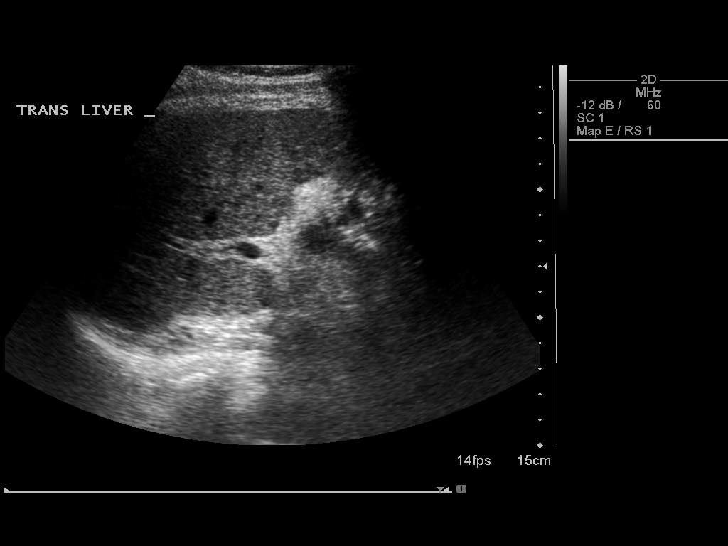
[im 10/37]
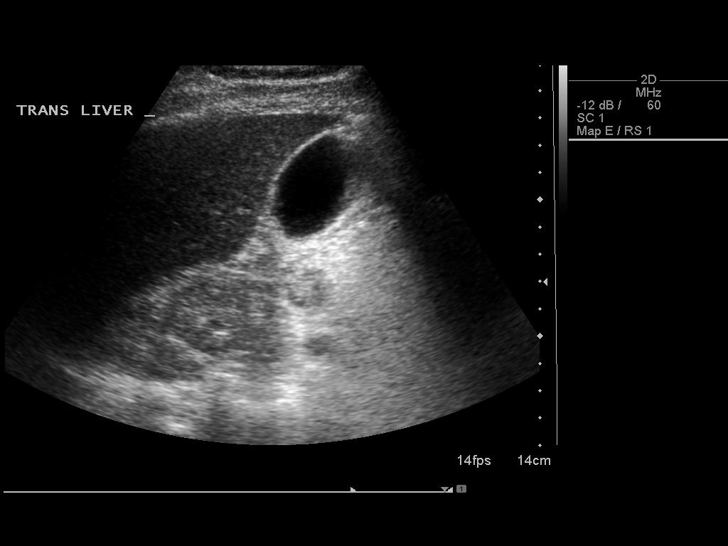
[im 13/37]
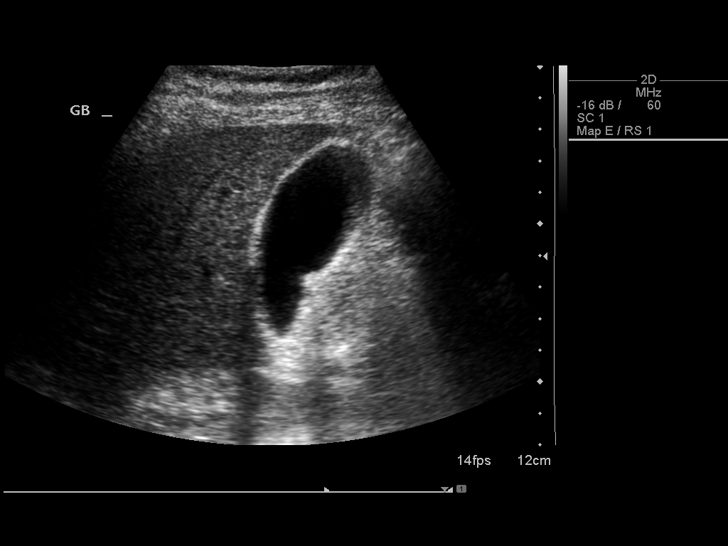
[im 14/37]
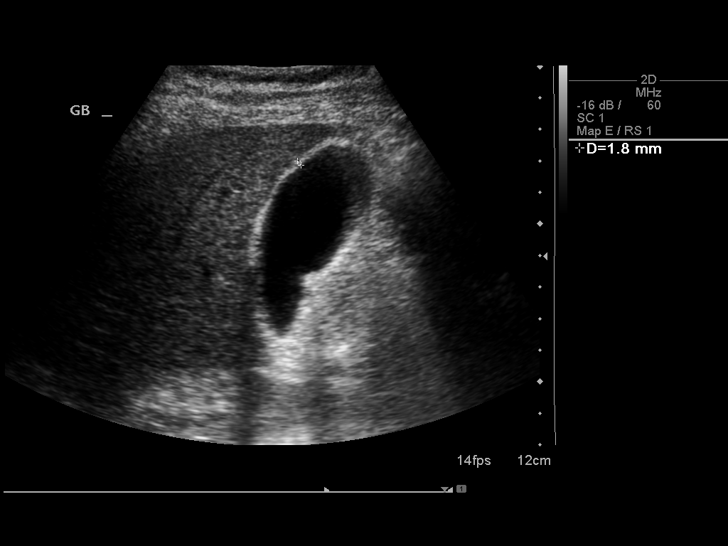
[im 17/37]
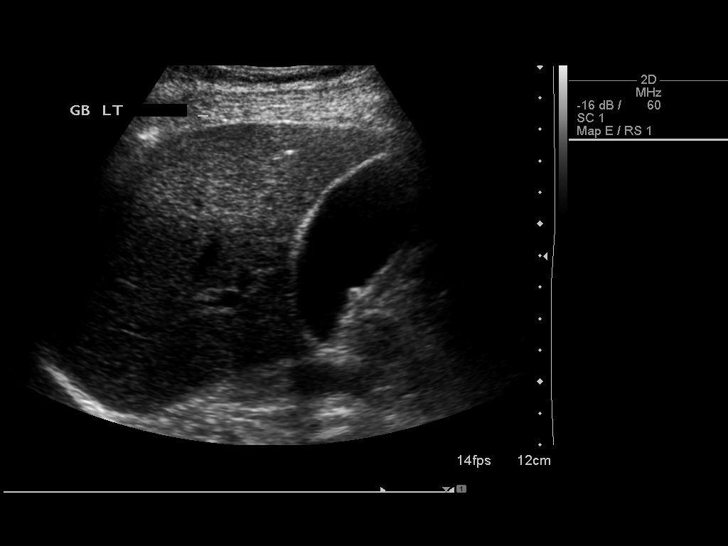
[im 20/37]
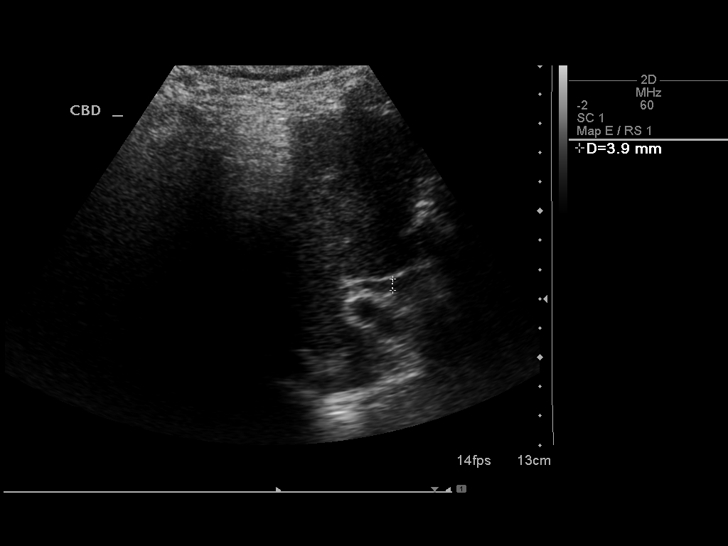
[im 23/37]
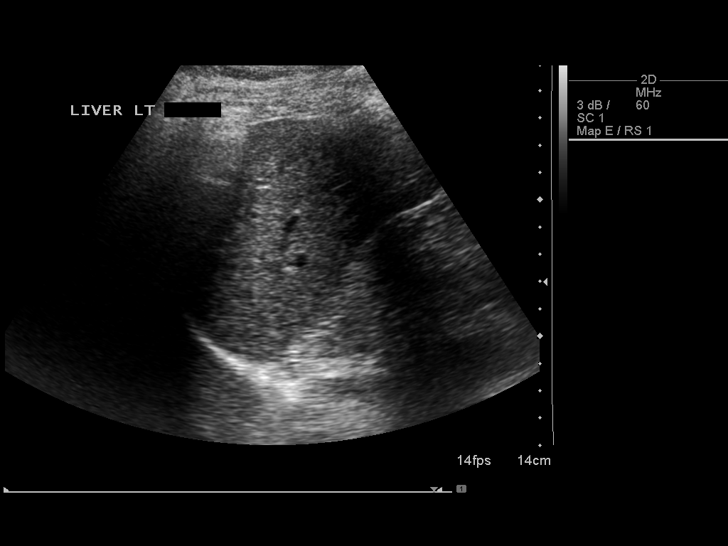
[im 25/37]
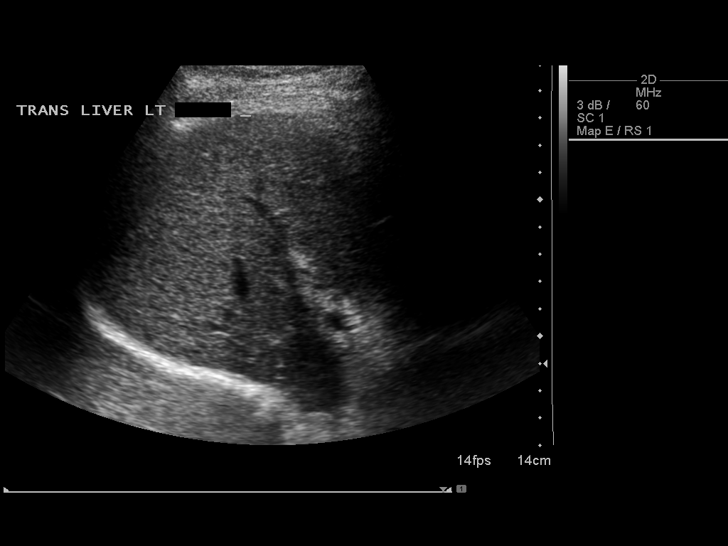
[im 28/37]
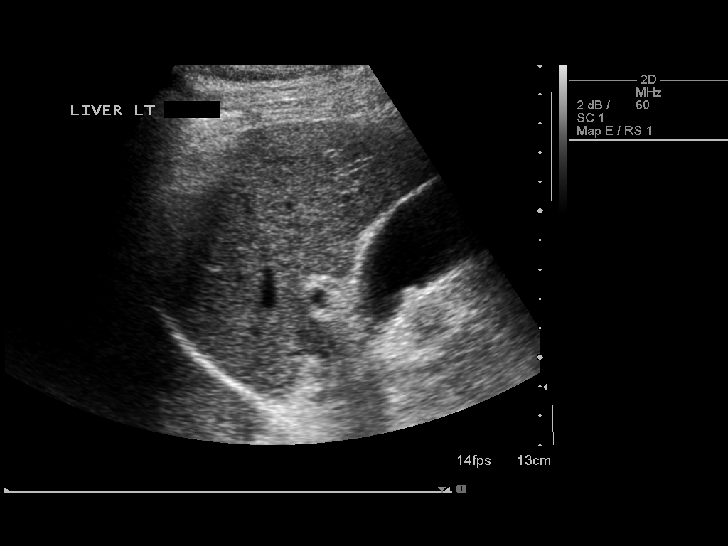
[im 31/37]
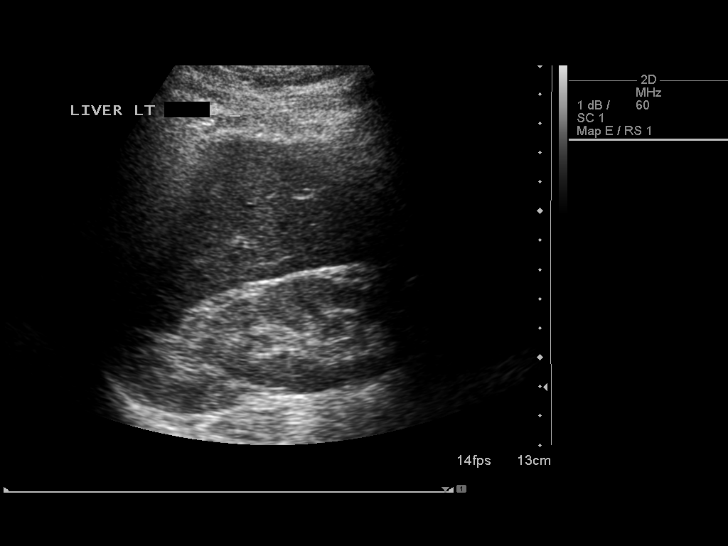
[im 34/37]
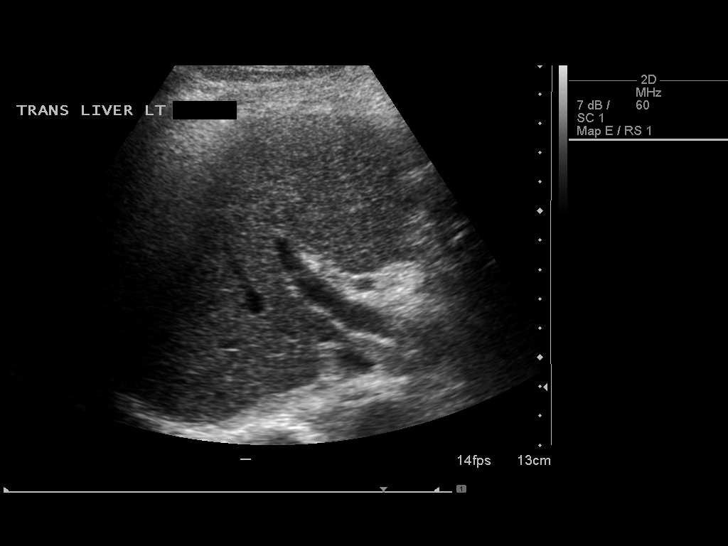
[im 37/37]
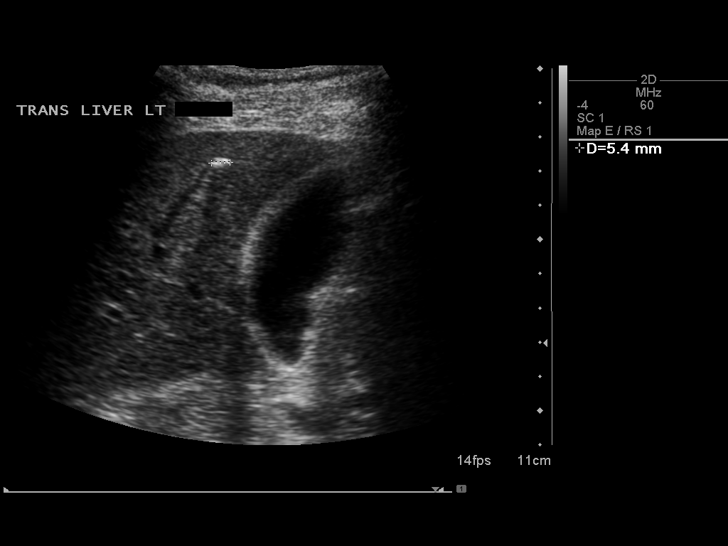

[14 of 25 positions shown; findings below may reference images not displayed]

FINDINGS: Gallbladder:

No shadowing gallstones or echogenic sludge. No gallbladder wall
thickening or pericholecystic fluid. Negative sonographic Murphy
sign according to the ultrasound technologist.

Common bile duct:

Diameter: Approximately 4 mm.

Liver:

Diffusely increased and coarsened echotexture without significant
focal hepatic parenchymal abnormality. Scattered shadowing
calcifications, the largest approximating 5 mm in the left lobe.
Patent portal vein with hepatopetal flow.
IMPRESSION: 1. Mild diffuse hepatic steatosis and/or hepatocellular disease with
scattered calcified hepatic granulomata. No significant focal
hepatic parenchymal abnormality.
2. Otherwise normal examination.

## 2016-10-10 DIAGNOSIS — K754 Autoimmune hepatitis: Secondary | ICD-10-CM | POA: Diagnosis not present

## 2016-10-27 IMAGING — US US BIOPSY
1 series · 12 of 12 positions shown · non-contrast
Comparison: None.

INDICATION: Elevated LFTs of uncertain etiology. Please perform
ultrasound-guided random liver biopsy for tissue diagnostic
purposes.

EXAM:
ULTRASOUND GUIDED LIVER BIOPSY

[Series 1: us biopsy · 0.22mm/px · 12 of 12 slices shown]
[im 1/12]
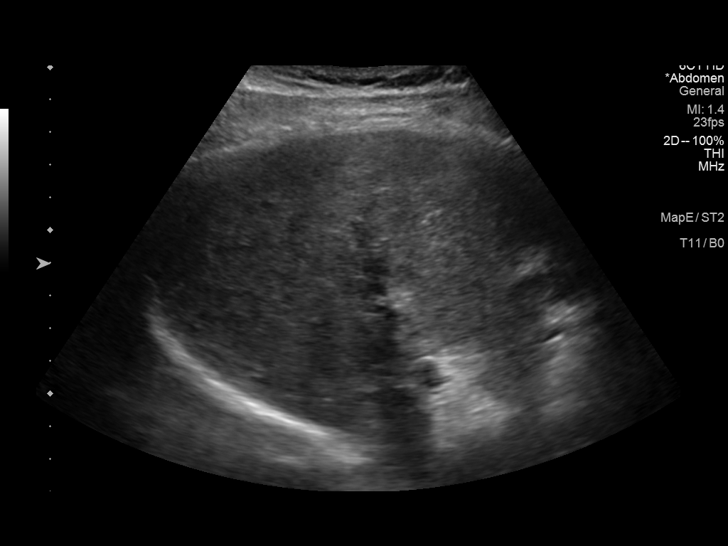
[im 2/12]
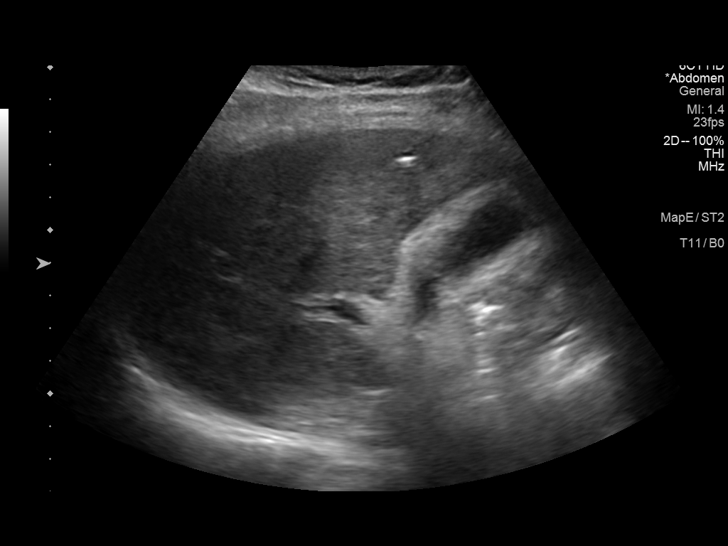
[im 3/12]
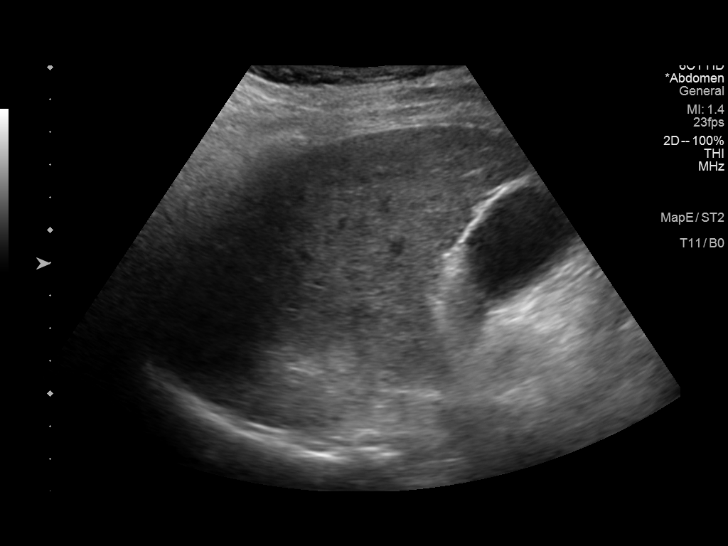
[im 4/12]
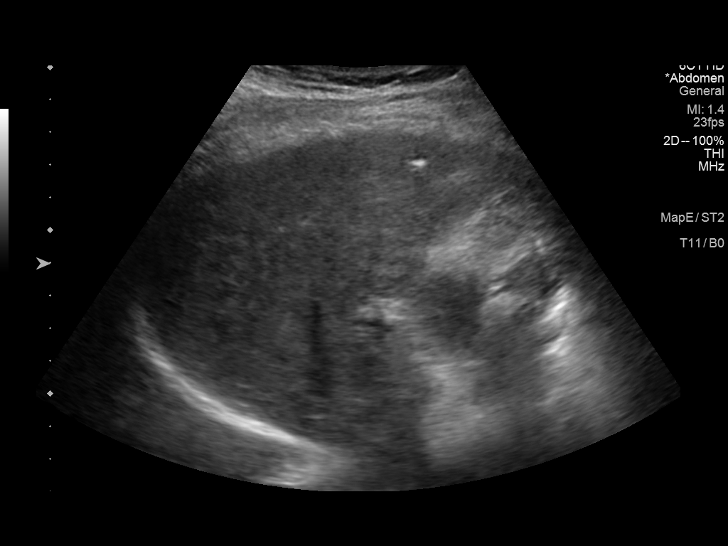
[im 5/12]
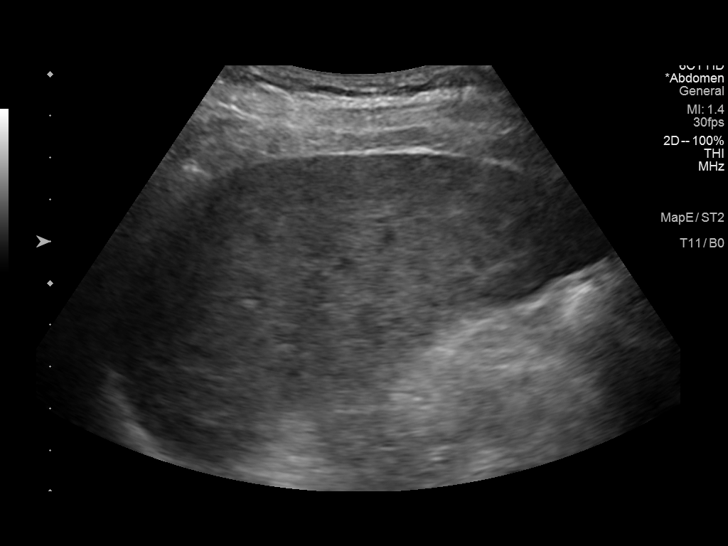
[im 6/12]
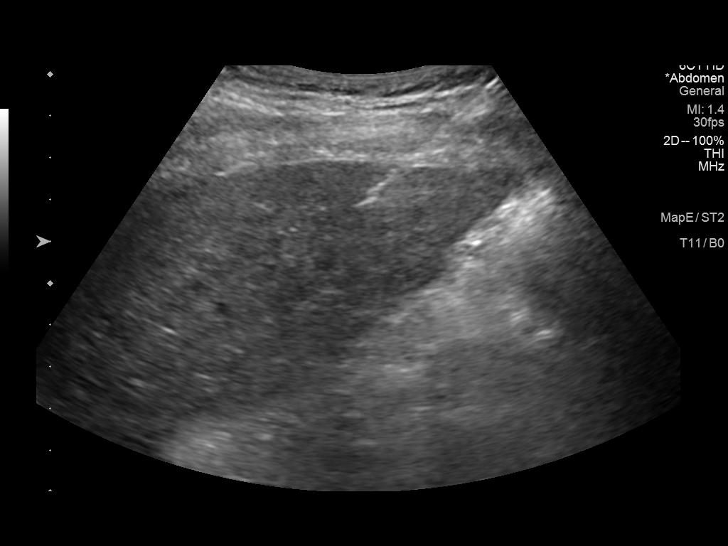
[im 7/12]
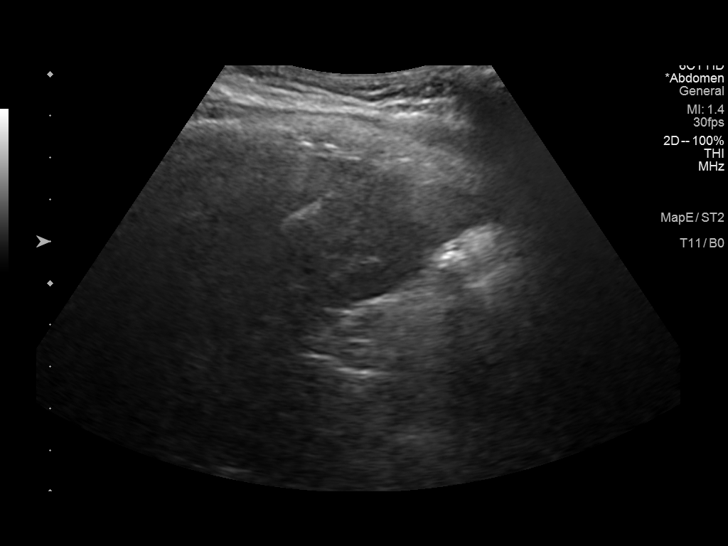
[im 8/12]
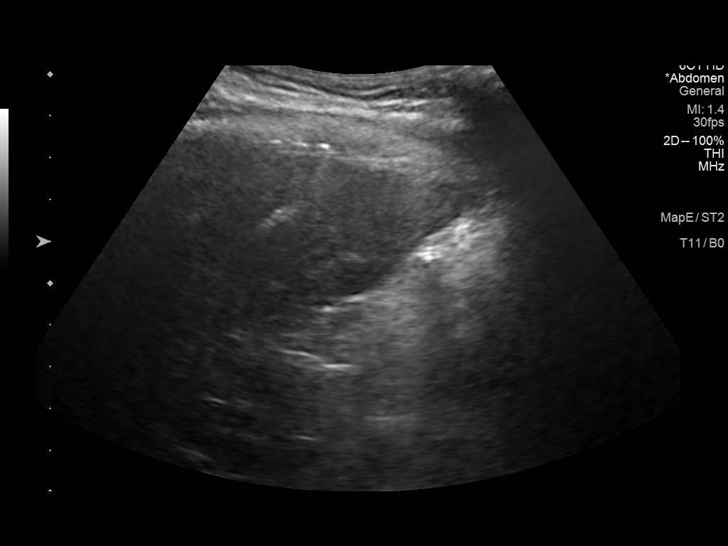
[im 9/12]
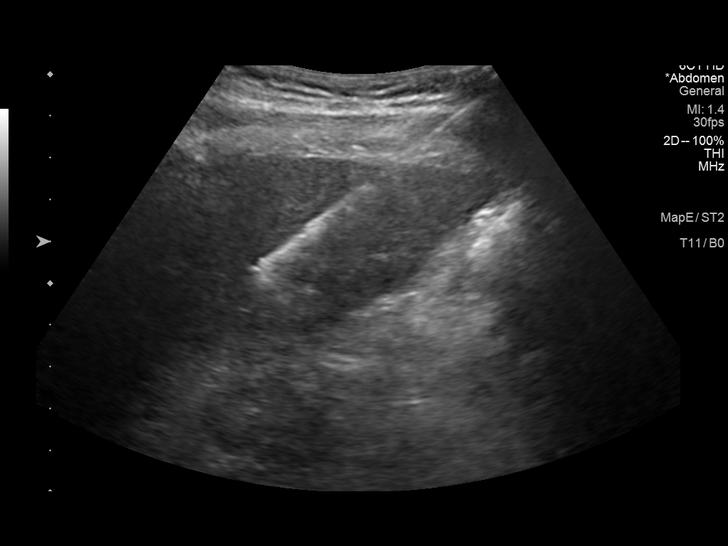
[im 10/12]
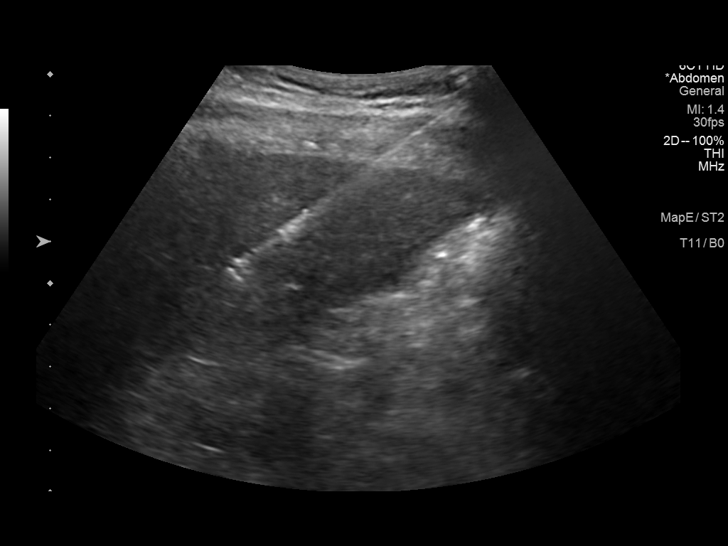
[im 11/12]
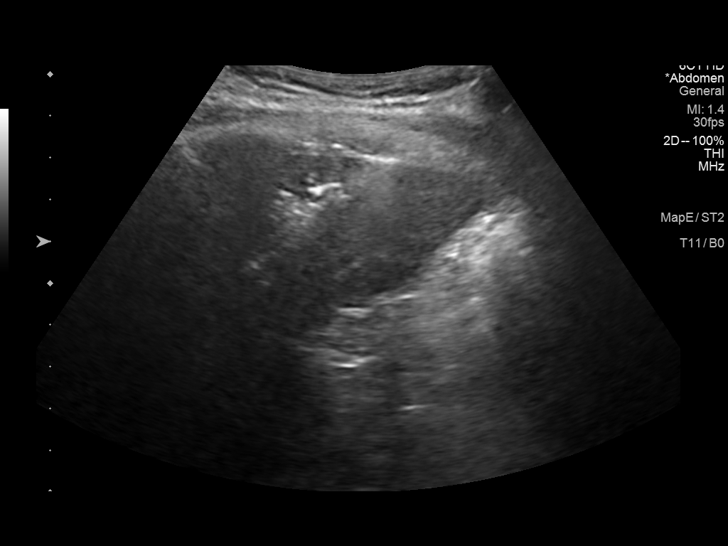
[im 12/12]
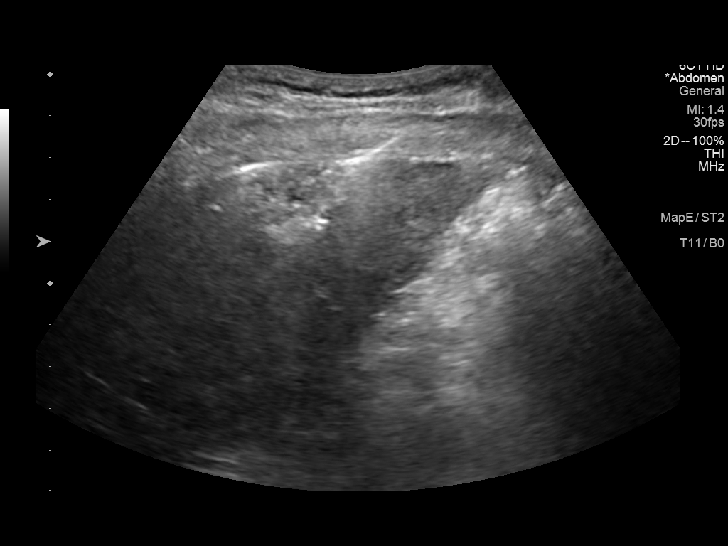

[12 of 12 positions shown; findings below may reference images not displayed]

MEDICATIONS:
None

ANESTHESIA/SEDATION:
Fentanyl 75 mcg IV; Versed 1.5 mg IV

Total Moderate Sedation time: 15 minutes; The patient was
continuously monitored during the procedure by the interventional
radiology nurse under my direct supervision.

COMPLICATIONS:
None immediate.

PROCEDURE:
Informed written consent was obtained from the patient after a
discussion of the risks, benefits and alternatives to treatment. The
patient understands and consents the procedure. A timeout was
performed prior to the initiation of the procedure.

Ultrasound scanning was performed of the right upper abdominal
quadrant and the procedure was planned. The right upper abdomen was
prepped and draped in the usual sterile fashion. The overlying soft
tissues were anesthetized with 1% lidocaine with epinephrine. A 17
gauge, 6.8 cm co-axial needle was advanced into a peripheral aspect
of the right lobe of the liver and 3 core biopsies were obtained
with an 18 gauge core device under direct ultrasound guidance.

The co-axial needle was removed and hemostasis was obtained with
manual compression. Post procedural scanning was negative for
definitive area of hemorrhage. A dressing was placed. The patient
tolerated the procedure well without immediate post procedural
complication.
IMPRESSION: Technically successful ultrasound guided liver biopsy.

## 2016-12-12 DIAGNOSIS — N183 Chronic kidney disease, stage 3 (moderate): Secondary | ICD-10-CM | POA: Diagnosis not present

## 2016-12-14 DIAGNOSIS — N183 Chronic kidney disease, stage 3 (moderate): Secondary | ICD-10-CM | POA: Diagnosis not present

## 2016-12-14 DIAGNOSIS — I129 Hypertensive chronic kidney disease with stage 1 through stage 4 chronic kidney disease, or unspecified chronic kidney disease: Secondary | ICD-10-CM | POA: Diagnosis not present

## 2016-12-14 DIAGNOSIS — N051 Unspecified nephritic syndrome with focal and segmental glomerular lesions: Secondary | ICD-10-CM | POA: Diagnosis not present

## 2016-12-15 DIAGNOSIS — K754 Autoimmune hepatitis: Secondary | ICD-10-CM | POA: Diagnosis not present

## 2016-12-16 IMAGING — DX DG CHEST 1V PORT
1 series · 1 of 1 positions shown · non-contrast
Comparison: None.

CLINICAL DATA: Shortness of breath for several days

EXAM:
PORTABLE CHEST 1 VIEW

[chest ap]
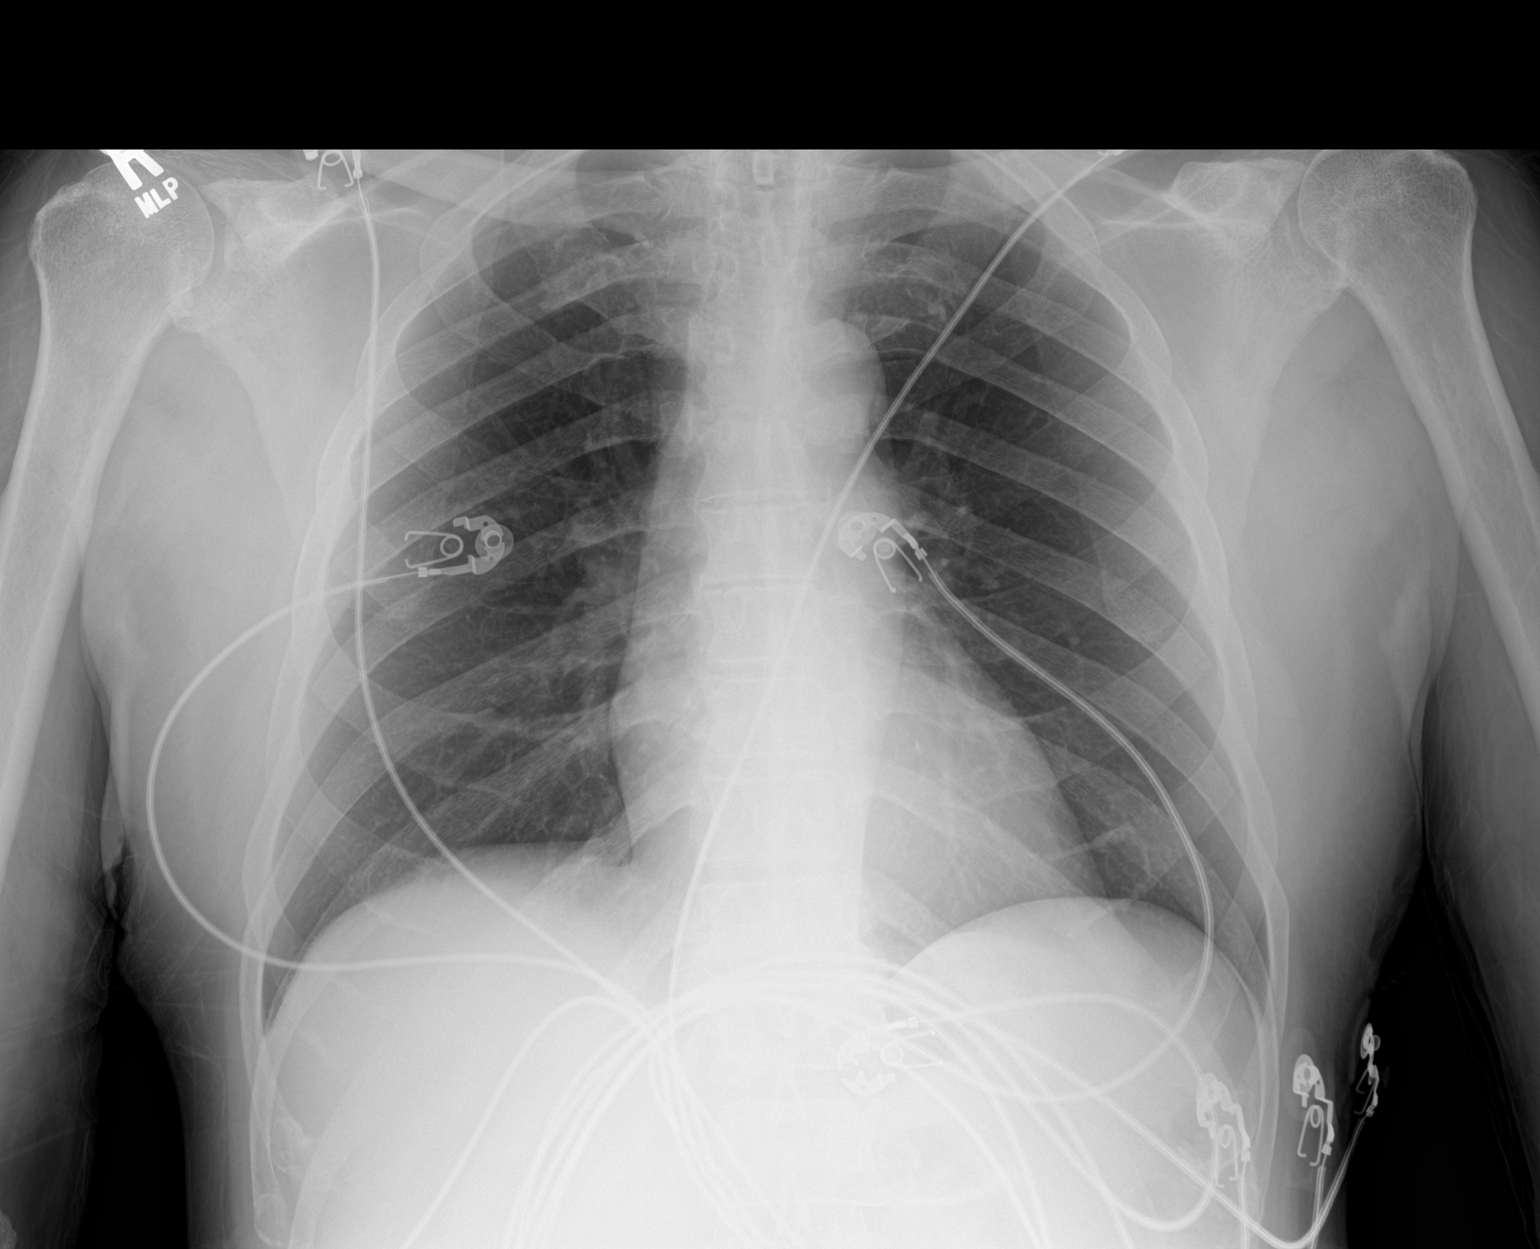

[1 of 1 positions shown; findings below may reference images not displayed]

FINDINGS: Lungs are clear. Heart size and pulmonary vascularity are normal. No
adenopathy. No bone lesions.
IMPRESSION: No edema or consolidation.

## 2016-12-22 IMAGING — DX DG CHEST 1V PORT
1 series · 1 of 1 positions shown · non-contrast
Comparison: 04/02/2015

CLINICAL DATA: Shortness of breath for 2 days

EXAM:
PORTABLE CHEST 1 VIEW

[chest ap]
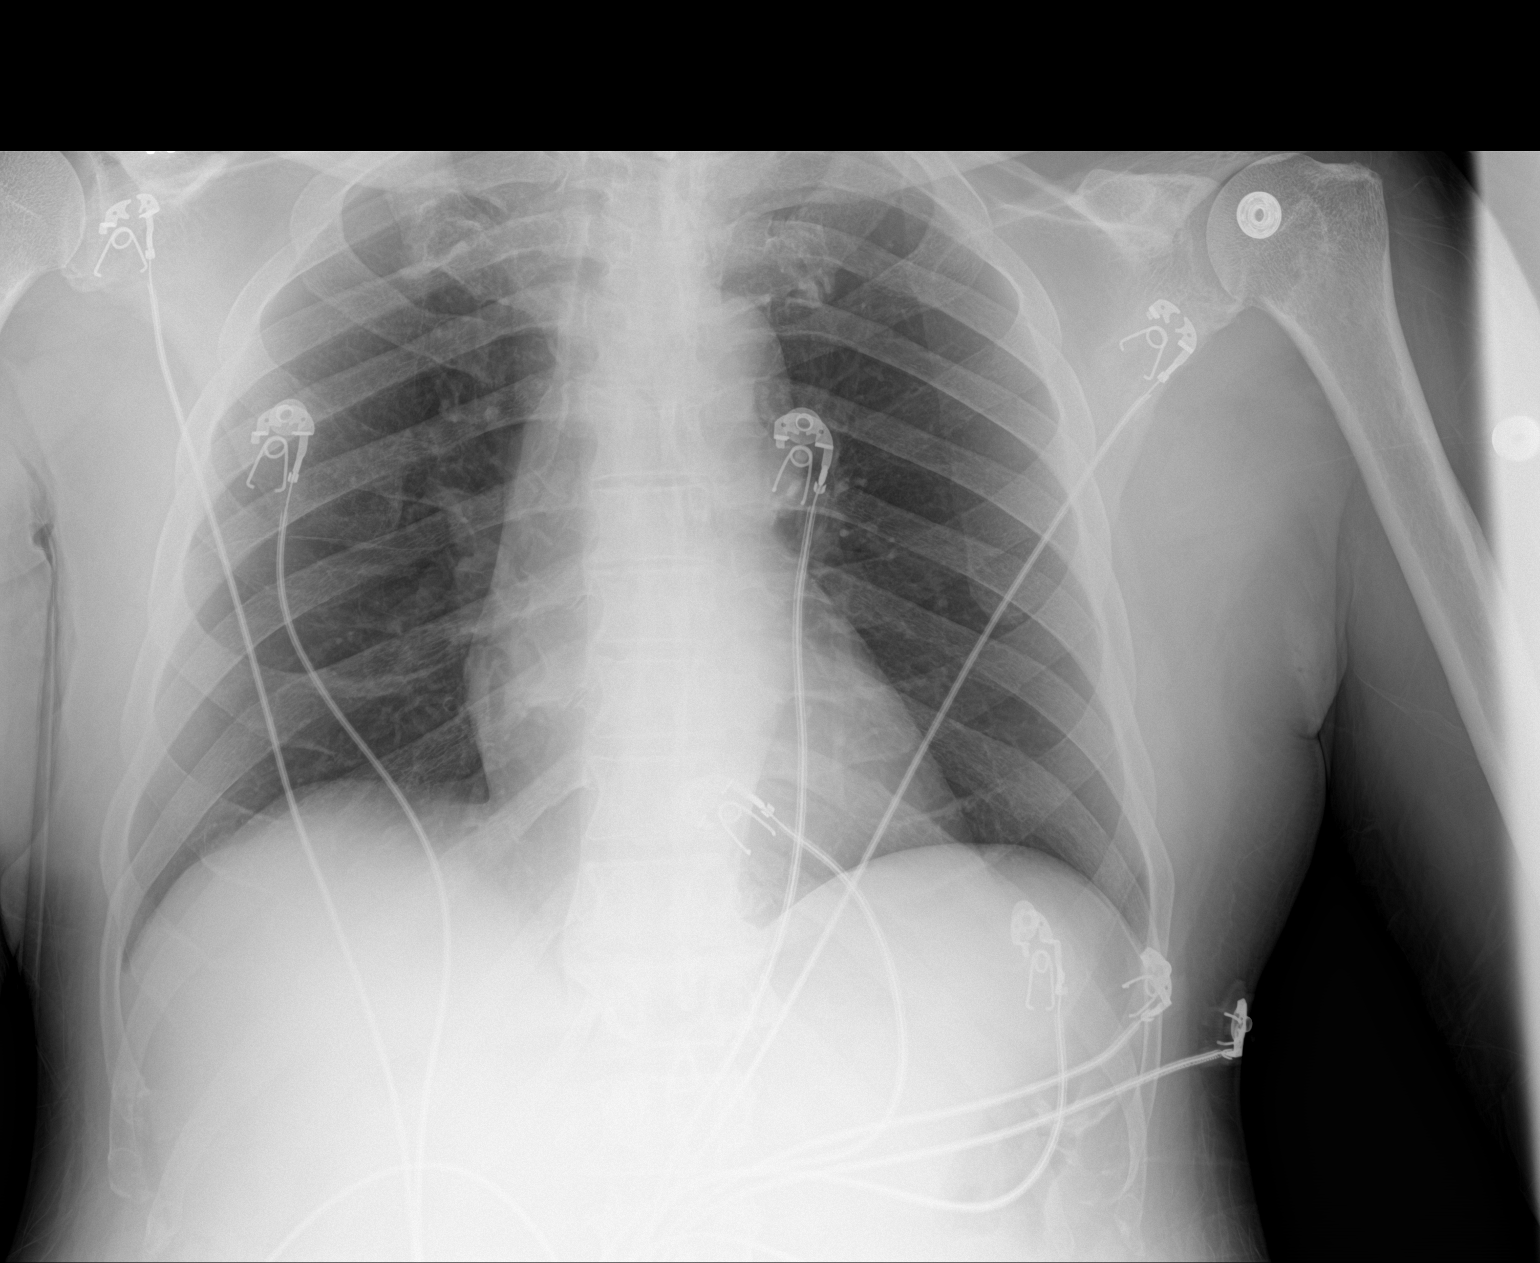

[1 of 1 positions shown; findings below may reference images not displayed]

FINDINGS: The heart size and mediastinal contours are within normal limits.
Both lungs are clear. The visualized skeletal structures are
unremarkable.
IMPRESSION: No active disease.

## 2017-01-25 DIAGNOSIS — K754 Autoimmune hepatitis: Secondary | ICD-10-CM | POA: Diagnosis not present

## 2017-01-25 DIAGNOSIS — N051 Unspecified nephritic syndrome with focal and segmental glomerular lesions: Secondary | ICD-10-CM | POA: Diagnosis not present

## 2017-01-25 DIAGNOSIS — I10 Essential (primary) hypertension: Secondary | ICD-10-CM | POA: Diagnosis not present

## 2017-01-25 DIAGNOSIS — D696 Thrombocytopenia, unspecified: Secondary | ICD-10-CM | POA: Diagnosis not present

## 2017-01-25 DIAGNOSIS — E7800 Pure hypercholesterolemia, unspecified: Secondary | ICD-10-CM | POA: Diagnosis not present

## 2017-01-25 DIAGNOSIS — Z0000 Encounter for general adult medical examination without abnormal findings: Secondary | ICD-10-CM | POA: Diagnosis not present

## 2017-02-16 DIAGNOSIS — K754 Autoimmune hepatitis: Secondary | ICD-10-CM | POA: Diagnosis not present

## 2017-03-20 DIAGNOSIS — N183 Chronic kidney disease, stage 3 (moderate): Secondary | ICD-10-CM | POA: Diagnosis not present

## 2017-03-21 DIAGNOSIS — I129 Hypertensive chronic kidney disease with stage 1 through stage 4 chronic kidney disease, or unspecified chronic kidney disease: Secondary | ICD-10-CM | POA: Diagnosis not present

## 2017-03-21 DIAGNOSIS — R808 Other proteinuria: Secondary | ICD-10-CM | POA: Diagnosis not present

## 2017-03-21 DIAGNOSIS — R81 Glycosuria: Secondary | ICD-10-CM | POA: Diagnosis not present

## 2017-03-21 DIAGNOSIS — K754 Autoimmune hepatitis: Secondary | ICD-10-CM | POA: Diagnosis not present

## 2017-03-21 DIAGNOSIS — N183 Chronic kidney disease, stage 3 (moderate): Secondary | ICD-10-CM | POA: Diagnosis not present

## 2017-03-21 DIAGNOSIS — N051 Unspecified nephritic syndrome with focal and segmental glomerular lesions: Secondary | ICD-10-CM | POA: Diagnosis not present

## 2017-05-15 DIAGNOSIS — K754 Autoimmune hepatitis: Secondary | ICD-10-CM | POA: Diagnosis not present

## 2017-05-15 DIAGNOSIS — R748 Abnormal levels of other serum enzymes: Secondary | ICD-10-CM | POA: Diagnosis not present

## 2017-07-24 DIAGNOSIS — N183 Chronic kidney disease, stage 3 (moderate): Secondary | ICD-10-CM | POA: Diagnosis not present

## 2017-07-26 DIAGNOSIS — I129 Hypertensive chronic kidney disease with stage 1 through stage 4 chronic kidney disease, or unspecified chronic kidney disease: Secondary | ICD-10-CM | POA: Diagnosis not present

## 2017-07-26 DIAGNOSIS — N184 Chronic kidney disease, stage 4 (severe): Secondary | ICD-10-CM | POA: Diagnosis not present

## 2017-07-26 DIAGNOSIS — R808 Other proteinuria: Secondary | ICD-10-CM | POA: Diagnosis not present

## 2017-08-22 DIAGNOSIS — K754 Autoimmune hepatitis: Secondary | ICD-10-CM | POA: Diagnosis not present

## 2017-10-30 DIAGNOSIS — N184 Chronic kidney disease, stage 4 (severe): Secondary | ICD-10-CM | POA: Diagnosis not present

## 2017-10-31 DIAGNOSIS — N184 Chronic kidney disease, stage 4 (severe): Secondary | ICD-10-CM | POA: Diagnosis not present

## 2017-10-31 DIAGNOSIS — K754 Autoimmune hepatitis: Secondary | ICD-10-CM | POA: Diagnosis not present

## 2017-10-31 DIAGNOSIS — R809 Proteinuria, unspecified: Secondary | ICD-10-CM | POA: Diagnosis not present

## 2017-10-31 DIAGNOSIS — N2581 Secondary hyperparathyroidism of renal origin: Secondary | ICD-10-CM | POA: Diagnosis not present

## 2017-10-31 DIAGNOSIS — I129 Hypertensive chronic kidney disease with stage 1 through stage 4 chronic kidney disease, or unspecified chronic kidney disease: Secondary | ICD-10-CM | POA: Diagnosis not present

## 2017-10-31 DIAGNOSIS — E559 Vitamin D deficiency, unspecified: Secondary | ICD-10-CM | POA: Diagnosis not present

## 2017-10-31 DIAGNOSIS — R808 Other proteinuria: Secondary | ICD-10-CM | POA: Diagnosis not present

## 2018-02-13 DIAGNOSIS — R142 Eructation: Secondary | ICD-10-CM | POA: Diagnosis not present

## 2018-02-13 DIAGNOSIS — H9193 Unspecified hearing loss, bilateral: Secondary | ICD-10-CM | POA: Diagnosis not present

## 2018-02-13 DIAGNOSIS — H6123 Impacted cerumen, bilateral: Secondary | ICD-10-CM | POA: Diagnosis not present

## 2018-04-26 DIAGNOSIS — R748 Abnormal levels of other serum enzymes: Secondary | ICD-10-CM | POA: Diagnosis not present

## 2018-04-26 DIAGNOSIS — D649 Anemia, unspecified: Secondary | ICD-10-CM | POA: Diagnosis not present

## 2018-07-10 DIAGNOSIS — N184 Chronic kidney disease, stage 4 (severe): Secondary | ICD-10-CM | POA: Diagnosis not present

## 2018-07-10 DIAGNOSIS — N2581 Secondary hyperparathyroidism of renal origin: Secondary | ICD-10-CM | POA: Diagnosis not present

## 2018-07-11 DIAGNOSIS — R809 Proteinuria, unspecified: Secondary | ICD-10-CM | POA: Diagnosis not present

## 2018-07-11 DIAGNOSIS — I129 Hypertensive chronic kidney disease with stage 1 through stage 4 chronic kidney disease, or unspecified chronic kidney disease: Secondary | ICD-10-CM | POA: Diagnosis not present

## 2018-07-11 DIAGNOSIS — N183 Chronic kidney disease, stage 3 (moderate): Secondary | ICD-10-CM | POA: Diagnosis not present

## 2020-01-24 DIAGNOSIS — N184 Chronic kidney disease, stage 4 (severe): Secondary | ICD-10-CM | POA: Diagnosis not present

## 2020-01-29 DIAGNOSIS — N184 Chronic kidney disease, stage 4 (severe): Secondary | ICD-10-CM | POA: Diagnosis not present

## 2020-01-29 DIAGNOSIS — I129 Hypertensive chronic kidney disease with stage 1 through stage 4 chronic kidney disease, or unspecified chronic kidney disease: Secondary | ICD-10-CM | POA: Diagnosis not present

## 2020-01-29 DIAGNOSIS — Z8616 Personal history of COVID-19: Secondary | ICD-10-CM | POA: Diagnosis not present

## 2020-01-29 DIAGNOSIS — N2581 Secondary hyperparathyroidism of renal origin: Secondary | ICD-10-CM | POA: Diagnosis not present

## 2020-03-02 DIAGNOSIS — I10 Essential (primary) hypertension: Secondary | ICD-10-CM | POA: Diagnosis not present

## 2020-03-02 DIAGNOSIS — Z0000 Encounter for general adult medical examination without abnormal findings: Secondary | ICD-10-CM | POA: Diagnosis not present

## 2020-03-02 DIAGNOSIS — E7800 Pure hypercholesterolemia, unspecified: Secondary | ICD-10-CM | POA: Diagnosis not present

## 2020-03-02 DIAGNOSIS — R142 Eructation: Secondary | ICD-10-CM | POA: Diagnosis not present

## 2020-03-02 DIAGNOSIS — N184 Chronic kidney disease, stage 4 (severe): Secondary | ICD-10-CM | POA: Diagnosis not present

## 2020-03-30 DIAGNOSIS — N184 Chronic kidney disease, stage 4 (severe): Secondary | ICD-10-CM | POA: Diagnosis not present

## 2020-03-31 DIAGNOSIS — I129 Hypertensive chronic kidney disease with stage 1 through stage 4 chronic kidney disease, or unspecified chronic kidney disease: Secondary | ICD-10-CM | POA: Diagnosis not present

## 2020-03-31 DIAGNOSIS — R7989 Other specified abnormal findings of blood chemistry: Secondary | ICD-10-CM | POA: Diagnosis not present

## 2020-03-31 DIAGNOSIS — N184 Chronic kidney disease, stage 4 (severe): Secondary | ICD-10-CM | POA: Diagnosis not present

## 2020-03-31 DIAGNOSIS — N2581 Secondary hyperparathyroidism of renal origin: Secondary | ICD-10-CM | POA: Diagnosis not present

## 2020-06-30 DIAGNOSIS — N184 Chronic kidney disease, stage 4 (severe): Secondary | ICD-10-CM | POA: Diagnosis not present

## 2020-06-30 DIAGNOSIS — R7989 Other specified abnormal findings of blood chemistry: Secondary | ICD-10-CM | POA: Diagnosis not present

## 2020-07-02 DIAGNOSIS — Z8616 Personal history of COVID-19: Secondary | ICD-10-CM | POA: Diagnosis not present

## 2020-07-02 DIAGNOSIS — I129 Hypertensive chronic kidney disease with stage 1 through stage 4 chronic kidney disease, or unspecified chronic kidney disease: Secondary | ICD-10-CM | POA: Diagnosis not present

## 2020-07-02 DIAGNOSIS — N184 Chronic kidney disease, stage 4 (severe): Secondary | ICD-10-CM | POA: Diagnosis not present

## 2020-07-02 DIAGNOSIS — R7989 Other specified abnormal findings of blood chemistry: Secondary | ICD-10-CM | POA: Diagnosis not present

## 2020-07-02 DIAGNOSIS — E673 Hypervitaminosis D: Secondary | ICD-10-CM | POA: Diagnosis not present
